# Patient Record
Sex: Female | Born: 1990 | Race: Black or African American | Hispanic: No | Marital: Single
Health system: Midwestern US, Community
[De-identification: ages and names within clinical notes are randomized; demographics above are authoritative.]

## PROBLEM LIST (undated history)

## (undated) DIAGNOSIS — F32A Depression, unspecified: Secondary | ICD-10-CM

## (undated) DIAGNOSIS — F419 Anxiety disorder, unspecified: Secondary | ICD-10-CM

## (undated) DIAGNOSIS — M797 Fibromyalgia: Secondary | ICD-10-CM

## (undated) DIAGNOSIS — F329 Major depressive disorder, single episode, unspecified: Secondary | ICD-10-CM

## (undated) HISTORY — PX: KNEE SURGERY: SHX244

---

## 2014-12-03 ENCOUNTER — Inpatient Hospital Stay
Admit: 2014-12-03 | Discharge: 2014-12-03 | Disposition: A | Discharge status: Home / Self Care | Payer: Self-pay | Attending: Emergency Medicine

## 2014-12-03 DIAGNOSIS — R51 Headache: Secondary | ICD-10-CM

## 2014-12-03 LAB — HCG URINE, QL. - POC: Pregnancy test,urine (POC): NEGATIVE

## 2014-12-03 LAB — URINALYSIS W/ REFLEX CULTURE
Bacteria: NEGATIVE /hpf
Bilirubin: NEGATIVE
Blood: NEGATIVE
Glucose: NEGATIVE mg/dL
Ketone: NEGATIVE mg/dL
Leukocyte Esterase: NEGATIVE
Nitrites: NEGATIVE
Protein: NEGATIVE mg/dL
Specific gravity: 1.015 (ref 1.003–1.030)
Urobilinogen: 0.2 EU/dL (ref 0.2–1.0)
pH (UA): 7 (ref 5.0–8.0)

## 2014-12-03 MED ORDER — BUTALBITAL-ACETAMINOPHEN-CAFFEINE 50 MG-325 MG-40 MG TAB
50-325-40 mg | ORAL_TABLET | ORAL | Status: DC | PRN
Start: 2014-12-03 — End: 2015-09-11

## 2014-12-03 MED ORDER — ACETAMINOPHEN 325 MG TABLET
325 mg | ORAL | Status: AC
Start: 2014-12-03 — End: 2014-12-03
  Administered 2014-12-03: 16:00:00 via ORAL

## 2014-12-03 MED FILL — MAPAP (ACETAMINOPHEN) 325 MG TABLET: 325 mg | ORAL | Qty: 2

## 2014-12-03 NOTE — ED Notes (Signed)
Patient alert and oriented and ambulatory out of ED.

## 2014-12-03 NOTE — ED Notes (Addendum)
Pt reports right sided headache x 2 weeks, recently got new glasses, also recently had an ear infection, pain occasionally on left, posterior side, denies n/v, denies shortness of breath or chest pain, reports occasional dizziness      Emergency Department Nursing Plan of Care       The Nursing Plan of Care is developed from the Nursing assessment and Emergency Department Attending provider initial evaluation.  The plan of care may be reviewed in the ???ED Provider note???.    The Plan of Care was developed with the following considerations:   Patient / Family readiness to learn indicated [id number] understanding  Persons(s) to be included in education: patient  Barriers to Learning/Limitations:No    Signed     REBECCA T Sharlot Gowda, RN    12/03/2014   11:15 AM

## 2014-12-03 NOTE — ED Notes (Signed)
Patient requesting something for pain. Provider notified and will place orders.

## 2014-12-03 NOTE — ED Notes (Addendum)
Discharge instructions and 1 prescriptions reviewed with patient. Patient verbalizes understanding and denies any questions.

## 2014-12-03 NOTE — ED Provider Notes (Addendum)
Patient is a 24 y.o. female presenting with headaches. The history is provided by the patient. No language interpreter was used.   Headache   This is a new problem. The current episode started more than 1 week ago (2 weeks). Episode frequency: daily. The problem has not changed since onset.The headache is aggravated by nothing. The pain is located in the frontal region. The quality of the pain is described as dull. The pain is moderate. Pertinent negatives include no fever, no near-syncope, no syncope, no weakness, no dizziness, no visual change, no nausea and no vomiting. She has tried NSAIDs for the symptoms.      Pt without migraine history presents with daily headaches for 2 weeks, coincident with losing her glasses and using a previous prescription. Has recently moved here from [place]. Family is all well. No other physical complaints, URI sx.     No past medical history on file.    No past surgical history on file.      No family history on file.    History     Social History   ??? Marital Status: N/A     Spouse Name: N/A   ??? Number of Children: N/A   ??? Years of Education: N/A     Occupational History   ??? Not on file.     Social History Main Topics   ??? Smoking status: Not on file   ??? Smokeless tobacco: Not on file   ??? Alcohol Use: Not on file   ??? Drug Use: Not on file   ??? Sexual Activity: Not on file     Other Topics Concern   ??? Not on file     Social History Narrative   ??? No narrative on file         ALLERGIES: Latex    Review of Systems   Constitutional: Negative for fever.   Cardiovascular: Negative for syncope and near-syncope.   Gastrointestinal: Negative for nausea and vomiting.   Neurological: Positive for headaches. Negative for dizziness and weakness.   All other systems reviewed and are negative.      Filed Vitals:    12/03/14 1050   BP: 119/96   Pulse: 76   Temp: 98.2 ??F (36.8 ??C)   Resp: 18   Height: 5\' 4"  (1.626 m)   Weight: 79.379 kg (175 lb)   SpO2: 98%            Physical Exam    Constitutional: She is oriented to person, place, and time. She appears well-developed and well-nourished. No distress.   Pleasant appropriate Black female here with young daughter   HENT:   Head: Normocephalic and atraumatic.   Nose: Nose normal.   Mouth/Throat: Oropharynx is clear and moist. No oropharyngeal exudate.   Macerated canals without edema or erythema, TMs clear   Eyes: Conjunctivae and EOM are normal. Pupils are equal, round, and reactive to light. Right eye exhibits no discharge. Left eye exhibits no discharge. No scleral icterus.   Neck: Normal range of motion. Neck supple. JVD present. No tracheal deviation present. No thyromegaly present.   Cardiovascular: Normal rate, regular rhythm and normal heart sounds.    No murmur heard.  Pulmonary/Chest: Effort normal and breath sounds normal. No stridor. No respiratory distress. She has no wheezes. She has no rales.   Abdominal: Soft. She exhibits no distension. There is no tenderness. There is no rebound and no guarding.   Musculoskeletal: Normal range of motion. She exhibits no edema  or tenderness.   Lymphadenopathy:     She has no cervical adenopathy.   Neurological: She is alert and oriented to person, place, and time. No cranial nerve deficit. Coordination normal.   Skin: Skin is warm and dry. No rash noted. She is not diaphoretic. No erythema. No pallor.   Psychiatric: She has a normal mood and affect. Her behavior is normal. Judgment and thought content normal.   Nursing note and vitals reviewed.       MDM    Procedures

## 2014-12-06 NOTE — Telephone Encounter (Signed)
Care manager called patient to follow up, patient is new to the area and needs primary care. Patient busy at time of call and states that she will call CM back later today.    Spero Geralds, MSW  [phone]

## 2014-12-09 NOTE — Telephone Encounter (Signed)
Care manager called patient to follow up. Identified as correct patient. Patient new to area, in need of primary care. Uninsured. CM [facility] as [facility]. Patient receptive to referral information and plans to follow up.    Spero GeraldsAlexandra Renwick Asman, MSW  [phone]

## 2015-01-11 DIAGNOSIS — J0100 Acute maxillary sinusitis, unspecified: Secondary | ICD-10-CM

## 2015-01-11 NOTE — ED Provider Notes (Signed)
HPI Comments: Tasha Jones is a 24 y.o. female who presents ambulatory to the ED c/o nasal congestion for 4 days.  She also c/o sore throat since yesterday and pain with swallowing since earlier today.  Pt endorses taking OTC sinus decongestant and Mucinex without any relief in symptoms.  She notes hx of strep throat in the past. Pt denies any recent illness or recent ABC.  She specifically denies any fevers, chills, nausea, vomiting, diarrhea, or abdominal pain.      PCP: None    There are no other complaints, changes or physical findings at this time.      The history is provided by the patient.        Past Medical History:   Diagnosis Date   ??? Asthma        Past Surgical History:   Procedure Laterality Date   ??? Hx orthopaedic       left knee   ??? Hx appendectomy     ??? Hx cesarean section     ??? Hx gyn       D and C   ??? Hx gyn       cyst removed from fallopian tube - laporoscopic   ??? Hx gyn       laporoscopic - to find IUD         History reviewed. No pertinent family history.    Social History     Social History   ??? Marital status: SINGLE     Spouse name: N/A   ??? Number of children: N/A   ??? Years of education: N/A     Occupational History   ??? Not on file.     Social History Main Topics   ??? Smoking status: Never Smoker   ??? Smokeless tobacco: Not on file   ??? Alcohol use Yes      Comment: occ   ??? Drug use: No   ??? Sexual activity: Not on file     Other Topics Concern   ??? Not on file     Social History Narrative         ALLERGIES: Latex    Review of Systems   Constitutional: Negative.  Negative for activity change, appetite change, chills, fatigue, fever and unexpected weight change.   HENT: Positive for congestion and sore throat. Negative for hearing loss, rhinorrhea, sneezing and voice change.    Eyes: Negative.  Negative for pain and visual disturbance.   Respiratory: Negative.  Negative for apnea, cough, choking, chest tightness and shortness of breath.     Cardiovascular: Negative.  Negative for chest pain and palpitations.   Gastrointestinal: Negative.  Negative for abdominal distention, abdominal pain, blood in stool, diarrhea, nausea and vomiting.   Genitourinary: Negative.  Negative for difficulty urinating, flank pain, frequency and urgency.        No discharge   Musculoskeletal: Negative.  Negative for arthralgias, back pain, myalgias and neck stiffness.   Skin: Negative.  Negative for color change and rash.   Neurological: Negative.  Negative for dizziness, seizures, syncope, speech difficulty, weakness, numbness and headaches.   Hematological: Negative for adenopathy.   Psychiatric/Behavioral: Negative.  Negative for agitation, behavioral problems, dysphoric mood and suicidal ideas. The patient is not nervous/anxious.        Vitals:    01/11/15 2012   BP: 143/83   Pulse: (!) 103   Resp: 20   Temp: 98.6 ??F (37 ??C)   SpO2: 98%   Weight: 83.9  kg (185 lb)   Height: 5\' 4"  (1.626 m)            Physical Exam   Nursing note and vitals reviewed.  Physical Examination: General appearance - WDWN, in no apparent distress  Head - NC/AT  Eyes - pupils equal, round  and reactive, extraocular eye movements intact, conj/sclera clear, anicteric  Mouth - mucous membranes moist, mildly swollen tonsils, left greater than right.  Erythema and exudate to BL tonsils.  Nose/Ears - nares clear, Tms & canals clear  Neck - supple, no significant adenopathy, trachea midline, no crepitus, c spine diffusely non-tender, no step offs  Chest - Normal respiratory effort, clear to auscultation bilaterally, no wheezes/rales/rhonchi  Heart - normal rate and regular rhythm, S1 and S2 normal, no murmurs, gallops, or rubs  Abdomen - soft, nontender, nondistended, nabs, no masses, guarding, rebound or rigidity  Neurological - alert, oriented, normal speech, cranial nerves intact, no focal motor findings, motor & sensory diffusely intact, normal gait   Extremities/MS - peripheral pulses normal, no pedal edema, all joints atraumatic, FROM, non-tender, no gross deformities, spine diffusely non-tender  Skin - normal coloration and turgor, no rashes, no lesions or lacerations        MDM  Number of Diagnoses or Management Options  Diagnosis management comments: DDx: strep throat, viral pharyngitis, URI       Amount and/or Complexity of Data Reviewed  Review and summarize past medical records: yes    Patient Progress  Patient progress: stable    ED Course       Procedures    MEDICATIONS GIVEN:  Medications   dexamethasone (DECADRON) injection 10 mg (10 mg Oral Given 01/11/15 2053)   cefTRIAXone (ROCEPHIN) 1 g in lidocaine (PF) (XYLOCAINE) 10 mg/mL (1 %) IM injection (1 g IntraMUSCular Given 01/11/15 2053)       IMPRESSION:  1. Acute tonsillitis, unspecified etiology    2. Acute maxillary sinusitis, recurrence not specified        PLAN:  1. Discharge home  Discharge Medication List as of 01/11/2015  8:45 PM      START taking these medications    Details   amoxicillin (AMOXIL) 875 mg tablet Take 1 Tab by mouth two (2) times a day for 10 days., Print, Disp-20 Tab, R-0      ibuprofen (MOTRIN) 600 mg tablet Take 1 Tab by mouth every six (6) hours as needed for Pain., Print, Disp-20 Tab, R-0      predniSONE (STERAPRED) 5 mg dose pack See administration instruction per 5mg  dose pack, Print, Disp-21 Tab, R-0         CONTINUE these medications which have NOT CHANGED    Details   butalbital-acetaminophen-caffeine (FIORICET) 50-325-40 mg per tablet Take 1 Tab by mouth every four (4) hours as needed for Headache. Max Daily Amount: 6 Tabs. Maximum dose 6 capsules daily, Print, Disp-15 Tab, R-0           2.   Follow-up Information     Follow up With Details Comments Contact Info    Trellis Moment, NP Schedule an appointment as soon as possible for a visit As needed [address]  [address]  [facility] [place] [address]  [phone]       [facility]   [address]  [place] [address]  [phone]      Return to ED if worse  DISCHARGE NOTE  8:55 PM  The patient has been re-evaluated and is ready for discharge. Reviewed available results with patient. Counseled pt on diagnosis and care plan. Pt has expressed understanding, and all questions have been answered. Pt agrees with plan and agrees to F/U as recommended, or return to the ED if their sxs worsen. Discharge instructions have been provided and explained to the pt, along with reasons to return to the ED.      This note is prepared by Norvel Richards, acting as Scribe for [employer]. Dorothey Baseman, MD.    Vergia Alcon S. Dorothey Baseman, MD: The scribe's documentation has been prepared under my direction and personally reviewed by me in its entirety. I confirm that the note above accurately reflects all work, treatment, procedures, and medical decision making performed by me.

## 2015-01-11 NOTE — ED Notes (Signed)
Discharge Instructions Reviewed with patient. Discharge instructions given to patient per this nurse. patient able to return verbalize discharge instructions. Paper copy of discharge instructions given. 3 RX given to patient per this nurse. Patient condition stable, Respiratory status WNL, Neurostatus intact. patient out of er, to home with self.

## 2015-01-11 NOTE — ED Notes (Signed)
Patient reports to ED c/o sore throat, nasal congestion, and HA since Saturday. Swelling and redness noted to throat. Patient in NAD.       Emergency Department Nursing Plan of Care       The Nursing Plan of Care is developed from the Nursing assessment and Emergency Department Attending provider initial evaluation.  The plan of care may be reviewed in the ???ED Provider note???.    The Plan of Care was developed with the following considerations:   Patient / Family readiness to learn indicated [id number] understanding  Persons(s) to be included in education: patient  Barriers to Learning/Limitations:No    Signed     Clarise Cruz, RN    01/11/2015   8:26 PM

## 2015-01-12 ENCOUNTER — Inpatient Hospital Stay
Admit: 2015-01-12 | Discharge: 2015-01-12 | Disposition: A | Discharge status: Home / Self Care | Payer: Self-pay | Attending: Emergency Medicine

## 2015-01-12 MED ORDER — IBUPROFEN 600 MG TAB
600 mg | ORAL_TABLET | Freq: Four times a day (QID) | ORAL | 0 refills | Status: DC | PRN
Start: 2015-01-12 — End: 2015-06-04

## 2015-01-12 MED ORDER — CEFTRIAXONE 1 GRAM SOLUTION FOR INJECTION
1 gram | Freq: Once | INTRAMUSCULAR | Status: AC
Start: 2015-01-12 — End: 2015-01-11
  Administered 2015-01-12: 01:00:00 via INTRAMUSCULAR

## 2015-01-12 MED ORDER — PREDNISONE 5 MG TABLETS IN A DOSE PACK
5 mg | ORAL_TABLET | ORAL | 0 refills | Status: DC
Start: 2015-01-12 — End: 2015-06-04

## 2015-01-12 MED ORDER — AMOXICILLIN 875 MG TAB
875 mg | ORAL_TABLET | Freq: Two times a day (BID) | ORAL | 0 refills | Status: AC
Start: 2015-01-12 — End: 2015-01-21

## 2015-01-12 MED ORDER — DEXAMETHASONE SODIUM PHOSPHATE 10 MG/ML IJ SOLN
10 mg/mL | INTRAMUSCULAR | Status: AC
Start: 2015-01-12 — End: 2015-01-11
  Administered 2015-01-12: 01:00:00 via ORAL

## 2015-01-12 MED FILL — CEFTRIAXONE 1 GRAM SOLUTION FOR INJECTION: 1 gram | INTRAMUSCULAR | Qty: 1

## 2015-01-12 MED FILL — DEXAMETHASONE SODIUM PHOSPHATE 10 MG/ML IJ SOLN: 10 mg/mL | INTRAMUSCULAR | Qty: 1

## 2015-06-04 ENCOUNTER — Emergency Department: Admit: 2015-06-04 | Payer: BLUE CROSS/BLUE SHIELD | Primary: Family Medicine

## 2015-06-04 ENCOUNTER — Inpatient Hospital Stay
Admit: 2015-06-04 | Discharge: 2015-06-04 | Disposition: A | Discharge status: Home / Self Care | Payer: BLUE CROSS/BLUE SHIELD | Attending: Emergency Medicine

## 2015-06-04 ENCOUNTER — Emergency Department: Payer: BLUE CROSS/BLUE SHIELD | Primary: Family Medicine

## 2015-06-04 DIAGNOSIS — M79652 Pain in left thigh: Secondary | ICD-10-CM

## 2015-06-04 MED ORDER — DIAZEPAM 5 MG TAB
5 mg | ORAL_TABLET | Freq: Two times a day (BID) | ORAL | 0 refills | Status: DC | PRN
Start: 2015-06-04 — End: 2015-08-03

## 2015-06-04 MED ORDER — IBUPROFEN 400 MG TAB
400 mg | ORAL | Status: AC
Start: 2015-06-04 — End: 2015-06-04
  Administered 2015-06-04: 17:00:00 via ORAL

## 2015-06-04 MED ORDER — HYDROCODONE-ACETAMINOPHEN 5 MG-325 MG TAB
5-325 mg | ORAL_TABLET | Freq: Four times a day (QID) | ORAL | 0 refills | Status: DC | PRN
Start: 2015-06-04 — End: 2015-08-03

## 2015-06-04 MED FILL — IBUPROFEN 400 MG TAB: 400 mg | ORAL | Qty: 2

## 2015-06-04 NOTE — ED Provider Notes (Signed)
HPI Comments: Tasha Jones is a 25 y.o. female who presents ambulatory to [facility] ED with cc of bilateral LE pain x 3 days. Pt states that she has also been experiencing bilateral lower back pain and lightheadedness. She notes that symptoms have been progressively worsening since onset with discomfort described as a sharp pain exacerbated by movement. Pt states that leg pain is primarily in bilateral medial aspect of the thighs with no relief from OTC medications. She denies any recent falls, injury, or trauma to the area. 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 Pt specifically denies any chest pain, hemoptysis, and SOB.     ZOX:WRUE    PMHx: asthma, s/p orthopedic surgery to left knee  Social Hx: - smoking; - EtOH; - illicit drug use    There are no other complains, changes, or physical findings at this time.    The history is provided by the patient. No language interpreter was used.        Past Medical History:   Diagnosis Date   ??? Asthma        Past Surgical History:   Procedure Laterality Date   ??? Hx orthopaedic       left knee   ??? Hx appendectomy     ??? Hx cesarean section     ??? Hx gyn       D and C   ??? Hx gyn       cyst removed from fallopian tube - laporoscopic   ??? Hx gyn       laporoscopic - to find IUD         No family history on file.    Social History     Social History   ??? Marital status: SINGLE     Spouse name: N/A   ??? Number of children: N/A   ??? Years of education: N/A     Occupational History   ??? Not on file.     Social History Main Topics   ??? Smoking status: Never Smoker   ??? Smokeless tobacco: Not on file   ??? Alcohol use Yes      Comment: occ   ??? Drug use: No   ??? Sexual activity: Not on file     Other Topics Concern   ??? Not on file     Social History Narrative         ALLERGIES: Latex    Review of Systems   Constitutional: Negative for chills and fever.   HENT: Negative for congestion.    Eyes: Negative for visual disturbance.    Respiratory: Negative for chest tightness.    Cardiovascular: Negative for chest pain and leg swelling.   Gastrointestinal: Negative for abdominal pain and vomiting.   Endocrine: Negative for polyuria.   Genitourinary: Negative for dysuria and frequency.   Musculoskeletal: Positive for back pain and myalgias.   Skin: Negative for color change.   Allergic/Immunologic: Negative for immunocompromised state.   Neurological: Positive for light-headedness. Negative for numbness.       Vitals:    06/04/15 1030 06/04/15 1031   BP: (!) 153/102 (!) 145/99   Pulse: 81    Resp: 16    SpO2: 99%    Weight: 79.4 kg (175 lb)    Height:  (1.626 m)             Physical Exam   Constitutional: She is oriented to person, place, and  time. She appears well-developed and well-nourished. No distress.   HENT:   Head: Normocephalic and atraumatic.   Right Ear: External ear normal.   Left Ear: External ear normal.   Nose: Nose normal.   Mouth/Throat: Oropharynx is clear and moist. No oropharyngeal exudate.   Eyes: Conjunctivae and EOM are normal. Pupils are equal, round, and reactive to light. Right eye exhibits no discharge. Left eye exhibits no discharge. No scleral icterus.   Neck: Normal range of motion. Neck supple. No JVD present. No tracheal deviation present.   Cardiovascular: Normal rate, regular rhythm, normal heart sounds and intact distal pulses.  Exam reveals no gallop and no friction rub.    No murmur heard.  Pulmonary/Chest: Effort normal and breath sounds normal. No respiratory distress. She has no wheezes. She has no rales. She exhibits no tenderness.   Abdominal: Soft. Bowel sounds are normal. She exhibits no distension and no mass. There is no tenderness. There is no rebound and no guarding.   Musculoskeletal: She exhibits tenderness. She exhibits no edema.   Decreased active and passive ROM secondary to pain.  Pain to bilateral abductors.  Negative SLR bilaterally.   Lymphadenopathy:      She has no cervical adenopathy.   Neurological: She is alert and oriented to person, place, and time. She has normal reflexes. No cranial nerve deficit. She exhibits normal muscle tone. Coordination normal.   Skin: Skin is warm and dry. She is not diaphoretic.   Psychiatric: She has a normal mood and affect. Her behavior is normal. Judgment and thought content normal.   Nursing note and vitals reviewed.       MDM  Number of Diagnoses or Management Options  Diagnosis management comments: DDx: DJD, strain, sprain, sciatica, DVT       Amount and/or Complexity of Data Reviewed  Tests in the radiology section of CPT??: reviewed and ordered  Review and summarize past medical records: yes    Patient Progress  Patient progress: stable    ED Course       Procedures    IMAGING RESULTS:   EXAM: DUPLEX LOWER EXT VENOUS BILAT  ??  INDICATION: Bilateral leg pain  ??  COMPARISON: None  ??  TECHNIQUE: Grayscale, Doppler color flow, and Doppler spectral analysis  sonographic imaging of the bilateral lower extremities  ??  FINDINGS: There is no evidence of filling defect within the lower extremity  veins, which demonstrate normal compressibility and flow.  ??  IMPRESSION  IMPRESSION:  ??  No deep venous thrombosis.  ??   ??      EXAM: XR SPINE LUMB 2 OR 3 V  ??  INDICATION: Bilateral leg pain and back pain.  ??  COMPARISON: None.  ??  TECHNIQUE: 3 views lumbar spine.  ??  FINDINGS: Vertebral body heights and intervertebral disc spaces are maintained.  There is no abnormality in alignment. The sacroiliac joints are unremarkable.  ??  IMPRESSION  IMPRESSION: No acute abnormality.   ??   ??       VITALS:  Patient Vitals for the past 12 hrs:   Pulse Resp BP SpO2   06/04/15 1031 - - (!) 145/99 -   06/04/15 1030 81 16 (!) 153/102 99 %       MEDICATIONS GIVEN:  Medications   ibuprofen (MOTRIN) tablet 800 mg (800 mg Oral Given 06/04/15 1224)       IMPRESSION:  1. Thigh pain, musculoskeletal, left    2. Thigh pain, musculoskeletal, right  PLAN:  1.    Current Discharge Medication List      START taking these medications    Details   HYDROcodone-acetaminophen (NORCO) 5-325 mg per tablet Take 1 Tab by mouth every six (6) hours as needed for Pain. Max Daily Amount: 4 Tabs.  Qty: 10 Tab, Refills: 0      diazePAM (VALIUM) 5 mg tablet Take 1 Tab by mouth every twelve (12) hours as needed (spasm). Max Daily Amount: 10 mg.  Qty: 10 Tab, Refills: 0         CONTINUE these medications which have NOT CHANGED    Details   butalbital-acetaminophen-caffeine (FIORICET) 50-325-40 mg per tablet Take 1 Tab by mouth every four (4) hours as needed for Headache. Max Daily Amount: 6 Tabs. Maximum dose 6 capsules daily  Qty: 15 Tab, Refills: 0           2.   Follow-up Information     Follow up With Details Comments Contact Info    Janece Canterbury, MD In 1 day As needed [address]  [address]  [place] [address]  [phone]          3. Return to ED if worse     Discharge Note:  1:15 PM  The patient has been re-evaluated and is ready for discharge. Reviewed available results with patient. Counseled patient on diagnosis and care plan. Patient has expressed understanding, and all questions have been answered. Patient agrees with plan and agrees to follow up as recommended, or to return to the ED if their symptoms worsen. Discharge instructions have been provided and explained to the patient, along with reasons to return to the ED.        This note is prepared by John Giovanni, acting as Scribe for [employer].    PA-C Greig Castilla [place]: The scribe's documentation has been prepared under my direction and personally reviewed by me in its entirety. I confirm that the note above accurately reflects all work, treatment, procedures, and medical decision making performed by me.

## 2015-06-04 NOTE — ED Notes (Signed)
Patient identified and read over and explained discharge instructions with time for questions by attending MD/PA.  Patient has verbalized understanding of discharge instructions.

## 2015-06-04 NOTE — ED Triage Notes (Addendum)
Triage Note: Pt c/o bilateral leg pain, worse to inner thighs since Friday morning. Has been laying in bed elevating them and applying ice. Has taken percocet, ativan, benadryl and ibuprofen with no relief. Has not taken anything this morning. Pt ambulated to triage without difficulty.

## 2015-08-03 ENCOUNTER — Inpatient Hospital Stay
Admit: 2015-08-03 | Discharge: 2015-08-03 | Disposition: A | Discharge status: Home / Self Care | Payer: Self-pay | Attending: Emergency Medicine

## 2015-08-03 DIAGNOSIS — G8921 Chronic pain due to trauma: Secondary | ICD-10-CM

## 2015-08-03 MED ORDER — TRAMADOL 50 MG TAB
50 mg | ORAL_TABLET | ORAL | 0 refills | Status: DC
Start: 2015-08-03 — End: 2015-08-03

## 2015-08-03 MED ORDER — MELOXICAM 15 MG TAB
15 mg | ORAL_TABLET | Freq: Every day | ORAL | 0 refills | Status: DC
Start: 2015-08-03 — End: 2015-09-17

## 2015-08-03 MED ORDER — HYDROCODONE-ACETAMINOPHEN 5 MG-325 MG TAB
5-325 mg | ORAL_TABLET | Freq: Four times a day (QID) | ORAL | 0 refills | Status: DC | PRN
Start: 2015-08-03 — End: 2015-09-11

## 2015-08-03 MED ORDER — TRAMADOL 50 MG TAB
50 mg | ORAL_TABLET | Freq: Four times a day (QID) | ORAL | 0 refills | Status: DC | PRN
Start: 2015-08-03 — End: 2015-08-03

## 2015-08-03 MED ORDER — TRAMADOL 50 MG TAB
50 mg | ORAL | Status: AC
Start: 2015-08-03 — End: 2015-08-03
  Administered 2015-08-03: 21:00:00 via ORAL

## 2015-08-03 MED FILL — TRAMADOL 50 MG TAB: 50 mg | ORAL | Qty: 1

## 2015-08-03 NOTE — ED Notes (Signed)
Pt waiting for narcotic prescription.

## 2015-08-03 NOTE — ED Provider Notes (Signed)
Patient is a 25 y.o. female presenting with leg pain.   Leg Pain    Pertinent negatives include no back pain and no neck pain.    No Trauma. Recurrent bilateral knee pain. Now out of hydrocodone.  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 [facility] and had MRIs of knees. PT was suggested, but was not able to follow up due to insurance issues.  To ED seeking pain relief.      Past Medical History:   Diagnosis Date   ??? Asthma        Past Surgical History:   Procedure Laterality Date   ??? HX APPENDECTOMY     ??? HX CESAREAN SECTION     ??? HX GYN      D and C   ??? HX GYN      cyst removed from fallopian tube - laporoscopic   ??? HX GYN      laporoscopic - to find IUD   ??? HX ORTHOPAEDIC      left knee         History reviewed. No pertinent family history.    Social History     Social History   ??? Marital status: SINGLE     Spouse name: N/A   ??? Number of children: N/A   ??? Years of education: N/A     Occupational History   ??? Not on file.     Social History Main Topics   ??? Smoking status: Never Smoker   ??? Smokeless tobacco: Not on file   ??? Alcohol use Yes      Comment: occ   ??? Drug use: No   ??? Sexual activity: Yes     Partners: Female     Other Topics Concern   ??? Not on file     Social History Narrative         ALLERGIES: Latex    Review of Systems   Constitutional: Negative for activity change, chills, diaphoresis and fatigue.   HENT: Negative for nosebleeds, trouble swallowing and voice change.    Eyes: Negative for discharge, itching and visual disturbance.   Respiratory: Negative for cough, choking, chest tightness, shortness of breath and wheezing.    Cardiovascular: Negative for chest pain, palpitations and leg swelling.   Gastrointestinal: Negative for abdominal pain, diarrhea, nausea and vomiting.   Genitourinary: Negative for dysuria, flank pain and urgency.   Musculoskeletal: Positive for arthralgias. Negative for back pain, neck pain and neck stiffness.    Skin: Negative for pallor and rash.   Neurological: Negative for seizures, syncope, weakness and headaches.   Psychiatric/Behavioral: Negative for agitation, confusion, self-injury, sleep disturbance and suicidal ideas.   All other systems reviewed and are negative.      Vitals:    08/03/15 1538   BP: (!) 149/96   Pulse: 76   Resp: 16   Temp: 98.8 ??F (37.1 ??C)   SpO2: 100%   Weight: 79.4 kg (175 lb)   Height: 5\' 4"  (1.626 m)            Physical Exam   Constitutional: She is oriented to person, place, and time. She appears well-developed and well-nourished.   HENT:   Head: Normocephalic and atraumatic.   Right Ear: External ear normal.   Left Ear: External ear normal.   Eyes: Pupils are equal, round, and reactive to light.   Neck: Normal range of motion. Neck supple.  Cardiovascular: Normal rate.    Pulmonary/Chest: Effort normal.   Abdominal: Soft. There is no tenderness.   Neurological: She is alert and oriented to person, place, and time.   Skin: Skin is warm and dry.   Psychiatric:   Tearful when discussing pain.     No lumbar ttp. Neg SLR   Mild diffuse tenderness to BLE B knees.   No effusion. Some clicking with knee ROM B.    No erythema. Distal NVI. Patellas tracking midline B.   Hips FROM. Ambulating without assistance.   Ankle DF/PF 5/5 B.    MDM  ED Course       Procedures         5:01 PM  At discharge [name] became tearful noting that she needs something for pain and she is worried that the ultram wont control her pain as she has taken it before. We discussed options. Discussed the ED role in management of chronic pain and our inability for reasons of safety, to prescribe large quantities of narcotis or to combine prescriptions.  She reluctantly seemed to understand.  Will change ultram prescription to hydrocodone (#8) and change nsaid from motrin to mobic    LABORATORY TESTS:  No results found for this or any previous visit (from the past 12 hour(s)).    IMAGING RESULTS:  No orders to display        MEDICATIONS GIVEN:  Medications   traMADol (ULTRAM) tablet 50 mg (50 mg Oral Given 08/03/15 1649)       IMPRESSION:  1. Bilateral chronic knee pain        PLAN:  1.   Current Discharge Medication List      START taking these medications    Details   meloxicam (MOBIC) 15 mg tablet Take 1 Tab by mouth daily.  Qty: 15 Tab, Refills: 0         CONTINUE these medications which have CHANGED    Details   HYDROcodone-acetaminophen (NORCO) 5-325 mg per tablet Take 1 Tab by mouth every six (6) hours as needed for Pain. Max Daily Amount: 4 Tabs.  Qty: 8 Tab, Refills: 0         CONTINUE these medications which have NOT CHANGED    Details   butalbital-acetaminophen-caffeine (FIORICET) 50-325-40 mg per tablet Take 1 Tab by mouth every four (4) hours as needed for Headache. Max Daily Amount: 6 Tabs. Maximum dose 6 capsules daily  Qty: 15 Tab, Refills: 0         STOP taking these medications       diazePAM (VALIUM) 5 mg tablet Comments:   Reason for Stopping:             2.   Follow-up Information     Follow up With Details Comments Contact Info    [facility]  [facility]   [address]  [address]  [place] [address]  [phone]        Return to ED if worse

## 2015-08-03 NOTE — ED Notes (Signed)
Patient (s)  given copy of dc instructions and 2 paper script(s) and 0 electronic scripts. Patient (s)  verbalized understanding of instructions and script (s). Patient given a current medication reconciliation form and verbalized understanding of their medications. Patient (s) verbalized understanding of the importance of discussing medications with his or her physician or clinic they will be following up with. Patient alert and oriented and in no acute distress. Patient offered wheelchair from treatment area to hospital entrance, patient denied wheelchair.

## 2015-08-03 NOTE — ED Notes (Signed)
Emergency Department Nursing Plan of Care       The Nursing Plan of Care is developed from the Nursing assessment and Emergency Department Attending provider initial evaluation.  The plan of care may be reviewed in the ???ED Provider note???.    The Plan of Care was developed with the following considerations:   Patient / Family readiness to learn indicated [id number]:verbalized understanding  Persons(s) to be included in education: patient  Barriers to Learning/Limitations:No    Signed     Stanford ScotlandMamta A Jailani Hogans, RN    08/03/2015   4:00 PM

## 2015-09-11 ENCOUNTER — Inpatient Hospital Stay
Admit: 2015-09-11 | Discharge: 2015-09-11 | Disposition: A | Discharge status: Home / Self Care | Payer: MEDICAID | Attending: Emergency Medicine

## 2015-09-11 DIAGNOSIS — M19072 Primary osteoarthritis, left ankle and foot: Secondary | ICD-10-CM

## 2015-09-11 LAB — CBC WITH AUTOMATED DIFF
ABS. BASOPHILS: 0.1 10*3/uL (ref 0.0–0.1)
ABS. EOSINOPHILS: 0.2 10*3/uL (ref 0.0–0.4)
ABS. LYMPHOCYTES: 2.7 10*3/uL (ref 0.8–3.5)
ABS. MONOCYTES: 0.6 10*3/uL (ref 0.0–1.0)
ABS. NEUTROPHILS: 8.7 10*3/uL — ABNORMAL HIGH (ref 1.8–8.0)
BASOPHILS: 1 % (ref 0–1)
EOSINOPHILS: 2 % (ref 0–7)
HCT: 34.4 % — ABNORMAL LOW (ref 35.0–47.0)
HGB: 12.1 g/dL (ref 11.5–16.0)
LYMPHOCYTES: 22 % (ref 12–49)
MCH: 28.2 PG (ref 26.0–34.0)
MCHC: 35.2 g/dL (ref 30.0–36.5)
MCV: 80.2 FL (ref 80.0–99.0)
MONOCYTES: 5 % (ref 5–13)
NEUTROPHILS: 70 % (ref 32–75)
PLATELET: 343 10*3/uL (ref 150–400)
RBC: 4.29 M/uL (ref 3.80–5.20)
RDW: 12.8 % (ref 11.5–14.5)
WBC: 12.3 10*3/uL — ABNORMAL HIGH (ref 3.6–11.0)

## 2015-09-11 LAB — RETICULOCYTE COUNT: Reticulocyte count: 0.9 % (ref 0.7–2.1)

## 2015-09-11 MED ORDER — HYDROCODONE-ACETAMINOPHEN 5 MG-325 MG TAB
5-325 mg | ORAL | Status: AC
Start: 2015-09-11 — End: 2015-09-11
  Administered 2015-09-11: 21:00:00 via ORAL

## 2015-09-11 MED ORDER — HYDROCODONE-ACETAMINOPHEN 5 MG-325 MG TAB
5-325 mg | ORAL_TABLET | ORAL | 0 refills | Status: DC | PRN
Start: 2015-09-11 — End: 2015-09-17

## 2015-09-11 MED ORDER — PREDNISONE 20 MG TAB
20 mg | ORAL | Status: AC
Start: 2015-09-11 — End: 2015-09-11
  Administered 2015-09-11: 21:00:00 via ORAL

## 2015-09-11 MED ORDER — PREDNISONE 20 MG TAB
20 mg | ORAL_TABLET | Freq: Every day | ORAL | 0 refills | Status: AC
Start: 2015-09-11 — End: 2015-09-16

## 2015-09-11 MED FILL — PREDNISONE 20 MG TAB: 20 mg | ORAL | Qty: 3

## 2015-09-11 MED FILL — HYDROCODONE-ACETAMINOPHEN 5 MG-325 MG TAB: 5-325 mg | ORAL | Qty: 1

## 2015-09-11 NOTE — ED Notes (Signed)
Patient  given copy of dc instructions and 1 paper script(s) and 1 electronic scripts.  Patient  verbalized understanding of instructions and script (s).  Patient given a current medication reconciliation form and verbalized understanding of their medications.   Patient verbalized understanding of the importance of discussing medications with  his or her physician or clinic they will be following up with.  Patient alert and oriented and in no acute distress.  Patient offered wheelchair from treatment area to hospital entrance, patient declines wheelchair.

## 2015-09-11 NOTE — ED Notes (Signed)
Patient here for bilateral leg/knee pain. Reports history of left knee surgery. Reports being in physical therapy for her knees.  Hx arthritis.  States "I just got sent to this pain management place where they do this cream and this gel injection."  Reports heat and ice applied at home and topical cream at home, Mobic and Tramadol for pain, reports no relief.  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    Provider at bedside.  Reports last at ortho practice 2 weeks ago, and reports first time at pain management clinic last week.          Emergency Department Nursing Plan of Care       The Nursing Plan of Care is developed from the Nursing assessment and Emergency Department Attending provider initial evaluation.  The plan of care may be reviewed in the ???ED Provider note???.    The Plan of Care was developed with the following considerations:   Patient / Family readiness to learn indicated ZO:[id number]:verbalized understanding  Persons(s) to be included in education: patient  Barriers to Learning/Limitations:No    Signed     Darnelle Catalanerial L White, RN    09/11/2015   4:44 PM

## 2015-09-11 NOTE — ED Provider Notes (Signed)
HPI Comments: Tasha Jones is a 25 y.o. female with pertinent PMHx of orthopaedic left knee surgery, arthritis and sickle cell presenting ambulatory to the ED c/o acute on chronic 9/10 bilateral aching knee pain x "a while". Pt states that she has followed up with orthopaedics (last visit two weeks ago) and pain management (last visit ~1 week ago). Pt notes that she has attempted knee braces, physical therapy, topical cream, ice/heat therapy, Mobic and Tramadol with no relief. Pt states that her orthopaedist has recently advised trying "jell injections" and weight loss, but does not advise knee replacements due to her age. Pt states that her sickle cell is not currently being managed. Pt specifically denies any leg swelling or fever/chills.    PCP: [facility]  Orthopaedics: Thorp J. Earlene Plater, MD  Social Hx: - tobacco use, + occasional alcohol use, - illicit drug use    There are no other complaints, changes, or physical findings at this time.    The history is provided by the patient. No language interpreter was used.        Past Medical History:   Diagnosis Date   ??? Arthritis    ??? Asthma    ??? Sickle cell crisis [facility])        Past Surgical History:   Procedure Laterality Date   ??? HX APPENDECTOMY     ??? HX CESAREAN SECTION     ??? HX GYN      D and C   ??? HX GYN      cyst removed from fallopian tube - laporoscopic   ??? HX GYN      laporoscopic - to find IUD   ??? HX ORTHOPAEDIC      left knee         History reviewed. No pertinent family history.    Social History     Social History   ??? Marital status: SINGLE     Spouse name: N/A   ??? Number of children: N/A   ??? Years of education: N/A     Occupational History   ??? Not on file.     Social History Main Topics   ??? Smoking status: Never Smoker   ??? Smokeless tobacco: Not on file   ??? Alcohol use Yes      Comment: occ   ??? Drug use: No   ??? Sexual activity: Yes     Partners: Female     Other Topics Concern   ??? Not on file     Social History Narrative         ALLERGIES: Latex     Review of Systems   Constitutional: Negative for chills and fever.   HENT: Negative for congestion, rhinorrhea, sneezing and sore throat.    Eyes: Negative for redness and visual disturbance.   Respiratory: Negative for shortness of breath.    Cardiovascular: Negative for leg swelling.   Gastrointestinal: Negative for abdominal pain, nausea and vomiting.   Genitourinary: Negative for difficulty urinating and frequency.   Musculoskeletal: Positive for arthralgias (bilateral knees). Negative for back pain and neck stiffness.   Skin: Negative for rash.   Neurological: Negative for dizziness, syncope, weakness and headaches.   Hematological: Negative for adenopathy.       Vitals:    09/11/15 1637   BP: 135/88   Pulse: 76   Resp: 16   Temp: 98.5 ??F (36.9 ??C)   SpO2: 96%   Weight: 81.6 kg (180 lb)   Height: 5\' 3"  (  1.6 m)            Physical Exam   Constitutional: She is oriented to person, place, and time. She appears well-developed and well-nourished.   Appears uncomfortable  Tearful   HENT:   Head: Normocephalic and atraumatic.   Mouth/Throat: Oropharynx is clear and moist.   Eyes: Conjunctivae and EOM are normal.   Neck: Normal range of motion and full passive range of motion without pain. Neck supple.   Cardiovascular: Normal rate, regular rhythm, S1 normal, S2 normal, normal heart sounds, intact distal pulses and normal pulses.    No murmur heard.  Pulmonary/Chest: Effort normal and breath sounds normal. No respiratory distress. She has no wheezes.   Abdominal: Soft. Normal appearance and bowel sounds are normal. She exhibits no distension. There is no tenderness. There is no rebound.   Musculoskeletal: Normal range of motion.   Neurological: She is alert and oriented to person, place, and time. She has normal strength.   Skin: Skin is warm, dry and intact. No rash noted.   Psychiatric: She has a normal mood and affect. Her speech is normal and behavior is normal. Judgment and thought content normal.    Nursing note and vitals reviewed.       MDM  Number of Diagnoses or Management Options  Diagnosis management comments: DDx: arthritis, sickle cell crisis, chronic pain       Amount and/or Complexity of Data Reviewed  Clinical lab tests: reviewed and ordered  Review and summarize past medical records: yes    Patient Progress  Patient progress: stable    ED Course       Procedures    Progress Note:  5:34 PM  Pt and/or family have been updated on their results. Pt will be sent home with orthopaedic follow up. Pt and/or pt's family are aware of the plan of care and are in agreement.   Written by Lina Sar Ford, ED Scribe, as dictated by Costella Hatcher, MD.     LABORATORY TESTS:  Recent Results (from the past 12 hour(s))   CBC WITH AUTOMATED DIFF    Collection Time: 09/11/15  5:07 PM   Result Value Ref Range    WBC 12.3 (H) 3.6 - 11.0 K/uL    RBC 4.29 3.80 - 5.20 M/uL    HGB 12.1 11.5 - 16.0 g/dL    HCT 98.1 (L) 19.1 - 47.0 %    MCV 80.2 80.0 - 99.0 FL    MCH 28.2 26.0 - 34.0 PG    MCHC 35.2 30.0 - 36.5 g/dL    RDW 47.8 29.5 - 62.1 %    PLATELET 343 150 - 400 K/uL    NEUTROPHILS 70 32 - 75 %    LYMPHOCYTES 22 12 - 49 %    MONOCYTES 5 5 - 13 %    EOSINOPHILS 2 0 - 7 %    BASOPHILS 1 0 - 1 %    ABS. NEUTROPHILS 8.7 (H) 1.8 - 8.0 K/UL    ABS. LYMPHOCYTES 2.7 0.8 - 3.5 K/UL    ABS. MONOCYTES 0.6 0.0 - 1.0 K/UL    ABS. EOSINOPHILS 0.2 0.0 - 0.4 K/UL    ABS. BASOPHILS 0.1 0.0 - 0.1 K/UL   RETICULOCYTE COUNT    Collection Time: 09/11/15  5:07 PM   Result Value Ref Range    Reticulocyte count 0.9 0.7 - 2.1 %       MEDICATIONS GIVEN:  Medications   HYDROcodone-acetaminophen (NORCO) 5-325 mg  per tablet 1 Tab (1 Tab Oral Given 09/11/15 1705)   predniSONE (DELTASONE) tablet 60 mg (60 mg Oral Given 09/11/15 1705)       IMPRESSION:  1. Osteoarthritis of both knees, unspecified osteoarthritis type    2. Chronic pain of both knees        PLAN:  1.   Current Discharge Medication List      START taking these medications    Details    predniSONE (DELTASONE) 20 mg tablet Take 3 Tabs by mouth daily for 5 days.  Qty: 15 Tab, Refills: 0      HYDROcodone-acetaminophen (NORCO) 5-325 mg per tablet Take 1 Tab by mouth every four (4) hours as needed for Pain. Max Daily Amount: 6 Tabs.  Qty: 8 Tab, Refills: 0           2.   Follow-up Information     Follow up With Details Comments Contact Info    Jeni Sallesustin C Dyer, DO In 2 days for orthopaedic follow up [address]  [address]  [place] [id number]  [phone]      Jeni Sallesustin C Dyer, DO In 2 days for orthopaedic follow up [address]  [address]  [place] [id number]  [phone]      [facility] EMERGENCY DEPT  As needed, If symptoms worsen [address]  [place] [id number]  [phone]        Return to ED if worse     DISCHARGE NOTE:  5:35 PM  The patient is ready for discharge. The patient's signs, symptoms, diagnosis, and discharge instructions have been discussed and the patient and/or family has conveyed their understanding. The patient and/or family is to follow up as recommended or return to the ER should their symptoms worsen. Plan has been discussed and the patient and/or family is in agreement.   Written by Lina SarBrooke A. Ala DachFord, ED Scribe, as dictated by Costella HatcherMichael W. Kerensa Nicklas, MD.     Attestation:  This note is prepared by Nehemiah SettleBrooke A. Ford, acting as Neurosurgeoncribe for Costella HatcherMichael W. Neiko Trivedi, MD.    Costella HatcherMichael W. Burdett Pinzon, MD: The scribe's documentation has been prepared under my direction and personally reviewed by me in its entirety. I confirm that the note above accurately reflects all work, treatment, procedures, and medical decision making performed by me.

## 2015-09-17 ENCOUNTER — Inpatient Hospital Stay
Admit: 2015-09-17 | Discharge: 2015-09-18 | Disposition: A | Discharge status: Home / Self Care | Payer: MEDICAID | Attending: Emergency Medicine

## 2015-09-17 ENCOUNTER — Emergency Department: Admit: 2015-09-18 | Payer: MEDICAID | Primary: Family Medicine

## 2015-09-17 DIAGNOSIS — N83202 Unspecified ovarian cyst, left side: Secondary | ICD-10-CM

## 2015-09-17 MED ORDER — OXYCODONE-ACETAMINOPHEN 5 MG-325 MG TAB
5-325 mg | ORAL | Status: AC
Start: 2015-09-17 — End: 2015-09-17
  Administered 2015-09-18: 01:00:00 via ORAL

## 2015-09-17 NOTE — ED Provider Notes (Signed)
HPI Comments: Tasha Jones is a 25 y.o. female with PMHx significant for ovarian cysts, asthma, who presents ambulatory to [facility] ED with cc of acute onset, progressively worsening cramping/dull LLQ abdominal pain on 09/15/2015. Patient reports associated nausea. She endorses taking Norco earlier this morning with no relief. Patient states she went to [facility] today and was told to be evaluated by the ED to have an [place] for a possible ovarian cyst. She notes her last BM was this morning and states it was normal. Patient states her LMP was on 09/02/2015. She specifically denies any hematochezia, fever, or dysuria.       Social History: (-) Tobacco, (+) EtOH, (-) Illicit Drugs   Surgical History: laparoscopic cyst removal, appendectomy, c-section    There are no other complaints, changes, or physical findings at this time.  Written by Earnest Rosier, ED Scribe, as dictated by Su Grand, DO.       The history is provided by the patient.        Past Medical History:   Diagnosis Date   ??? Arthritis    ??? Asthma    ??? Sickle cell crisis [facility])        Past Surgical History:   Procedure Laterality Date   ??? HX APPENDECTOMY     ??? HX CESAREAN SECTION     ??? HX GYN      D and C   ??? HX GYN      cyst removed from fallopian tube - laporoscopic   ??? HX GYN      laporoscopic - to find IUD   ??? HX ORTHOPAEDIC      left knee         No family history on file.    Social History     Social History   ??? Marital status: SINGLE     Spouse name: N/A   ??? Number of children: N/A   ??? Years of education: N/A     Occupational History   ??? Not on file.     Social History Main Topics   ??? Smoking status: Never Smoker   ??? Smokeless tobacco: Not on file   ??? Alcohol use Yes      Comment: occ   ??? Drug use: No   ??? Sexual activity: Yes     Partners: Female     Other Topics Concern   ??? Not on file     Social History Narrative         ALLERGIES: Latex    Review of Systems   Constitutional: Negative for fatigue and fever.   HENT: Negative.     Eyes: Negative.    Respiratory: Negative for shortness of breath and wheezing.    Cardiovascular: Negative for chest pain and leg swelling.   Gastrointestinal: Positive for abdominal pain and nausea. Negative for blood in stool, constipation, diarrhea and vomiting.   Endocrine: Negative.    Genitourinary: Negative for difficulty urinating and dysuria.   Musculoskeletal: Negative.    Skin: Negative for rash.   Allergic/Immunologic: Negative.    Neurological: Negative for weakness and numbness.   Hematological: Negative.    Psychiatric/Behavioral: Negative.        Vitals:    09/17/15 1930   BP: (!) 139/92   Pulse: 73   Resp: 18   Temp: 97.8 ??F (36.6 ??C)   SpO2: 100%   Weight: 81.1 kg (178 lb 12.7 oz)   Height:  (1.626 m)  Physical Exam   Constitutional: She is oriented to person, place, and time. She appears well-developed and well-nourished. No distress.   HENT:   Head: Normocephalic and atraumatic.   Mouth/Throat: Oropharynx is clear and moist.   Eyes: Conjunctivae and EOM are normal.   Neck: Neck supple. No JVD present. No tracheal deviation present.   Cardiovascular: Normal rate, regular rhythm and intact distal pulses.  Exam reveals no gallop and no friction rub.    No murmur heard.  Pulmonary/Chest: Effort normal and breath sounds normal. No stridor. No respiratory distress. She has no wheezes.   Abdominal: Soft. Bowel sounds are normal. She exhibits no distension and no mass. There is tenderness in the left lower quadrant. There is no guarding.   Musculoskeletal: Normal range of motion. She exhibits no edema or tenderness.   No deformity   Neurological: She is alert and oriented to person, place, and time. She has normal strength.   No focal deficits   Skin: Skin is warm, dry and intact. No rash noted.   Psychiatric: She has a normal mood and affect. Her behavior is normal. Judgment and thought content normal.   Nursing note and vitals reviewed.       MDM   Number of Diagnoses or Management Options  Abdominal pain, LLQ (left lower quadrant):   Left ovarian cyst:   Diagnosis management comments: DDx: ovarian cyst, uti, ovarian torsion, constipation. [facility], already had an unremarkable CBC, BMP, UA and negative upt. Will get transvaginal US, treat symptomatically with analgesia.        Amount and/or Complexity of Data Reviewed  Tests in the radiology section of CPT??: ordered and reviewed  Review and summarize past medical records: yes    Patient Progress  Patient progress: stable    ED Course       Procedures    PROGRESS NOTE  7:58 PM   They did CBC, BMP, and pelvic exam. She had a WBC of 11.7. BMP was unremarkable. Her urine showed no infection. No clue cells, trichomonas, or yeast. Negative pregnancy test.    Written by Earnest RosierAlya Aboulhosn, ED Scribe, as dictated by Su Grandonstance S. Wynn Alldredge, DO.    IMAGING RESULTS:  US TRANSVAGINAL   Final Result   HISTORY: Left lower quadrant pain  ??  Technique: Routine transvaginal ultrasound images were obtained.  ??  FINDINGS:   Transvaginal images demonstrate a normal endometrial stripe measuring 5 mm in  thickness. The uterus measures 6.5 x 5.1 x 5.3 cm. No myometrial mass is seen.   The cervix has a normal appearance. The right ovary measures 3.2 cm. The left  ovary measures 4.7 cm. There is intact vascularity to both ovaries. There is a  left ovarian cyst measuring 3.9 x 2.6 x 3 cm. The cervix contains calcifications  and nabothian cysts. There is no pelvic free fluid.   ??  IMPRESSION  IMPRESSION:  Left ovarian cyst measuring 3.9 cm.       MEDICATIONS GIVEN:  Medications   oxyCODONE-acetaminophen (PERCOCET) 5-325 mg per tablet 2 Tab (2 Tabs Oral Given 09/17/15 2100)   ibuprofen (MOTRIN) tablet 600 mg (600 mg Oral Given 09/17/15 2100)       IMPRESSION:  1. Left ovarian cyst    2. Abdominal pain, LLQ (left lower quadrant)        PLAN:  1.   Discharge Medication List as of 09/17/2015 10:04 PM       START taking these medications  Details   oxyCODONE-acetaminophen (PERCOCET) 5-325 mg per tablet Take 1 Tab by mouth every four (4) hours as needed for Pain. Max Daily Amount: 6 Tabs., Print, Disp-20 Tab, R-0         CONTINUE these medications which have NOT CHANGED    Details   glucosamine (GLUCOSAMINE RELIEF) 1,000 mg tab Take  by mouth., Historical Med      ergocalciferol (VITAMIN D2) 50,000 unit capsule Take 50,000 Units by mouth., Historical Med         STOP taking these medications       predniSONE (DELTASONE) 20 mg tablet Comments:   Reason for Stopping:             2.   Follow-up Information     Follow up With Details Comments Contact Info    Your OBGYN Schedule an appointment as soon as possible for a visit in 1 day            Return to ED if worse     Discharge Note:  10:04 PM  The patient has been re-evaluated and is ready for discharge. Reviewed available results with patient. Counseled patient on diagnosis and care plan. Patient has expressed understanding, and all questions have been answered. Patient agrees with plan and agrees to follow up as recommended, or to return to the ED if their symptoms worsen. Discharge instructions have been provided and explained to the patient, along with reasons to return to the ED.  Written by Earnest Rosier, ED Scribe, as dictated by Su Grand, DO.    This note is prepared by Earnest Rosier, acting as Neurosurgeon for Su Grand, DO.    Su Grand, DO: The scribe's documentation has been prepared under my direction and personally reviewed by me in its entirety. I confirm that the note above accurately reflects all work, treatment, procedures, and medical decision making performed by me.

## 2015-09-17 NOTE — ED Notes (Signed)
The physician has reviewed discharge instructions with the patient.  The patient verbalized understanding. Patient ambulated out of the Emergency Department

## 2015-09-18 MED ORDER — OXYCODONE-ACETAMINOPHEN 5 MG-325 MG TAB
5-325 mg | ORAL_TABLET | ORAL | 0 refills | Status: DC | PRN
Start: 2015-09-18 — End: 2016-02-13

## 2015-09-18 MED ORDER — DIPHENHYDRAMINE 25 MG CAP
25 mg | ORAL_CAPSULE | Freq: Four times a day (QID) | ORAL | 0 refills | Status: AC | PRN
Start: 2015-09-18 — End: 2015-09-27

## 2015-09-18 MED ORDER — IBUPROFEN 600 MG TAB
600 mg | ORAL | Status: AC
Start: 2015-09-18 — End: 2015-09-17
  Administered 2015-09-18: 01:00:00 via ORAL

## 2015-09-18 MED FILL — OXYCODONE-ACETAMINOPHEN 5 MG-325 MG TAB: 5-325 mg | ORAL | Qty: 2

## 2015-09-18 MED FILL — IBUPROFEN 600 MG TAB: 600 mg | ORAL | Qty: 1

## 2015-11-05 ENCOUNTER — Inpatient Hospital Stay
Admit: 2015-11-05 | Discharge: 2015-11-05 | Disposition: A | Discharge status: Home / Self Care | Payer: MEDICAID | Attending: Emergency Medicine

## 2015-11-05 ENCOUNTER — Emergency Department: Admit: 2015-11-05 | Payer: MEDICAID | Primary: Family Medicine

## 2015-11-05 DIAGNOSIS — G8929 Other chronic pain: Secondary | ICD-10-CM

## 2015-11-05 LAB — CBC WITH AUTOMATED DIFF
ABS. BASOPHILS: 0.1 10*3/uL (ref 0.0–0.1)
ABS. EOSINOPHILS: 0.3 10*3/uL (ref 0.0–0.4)
ABS. LYMPHOCYTES: 2.6 10*3/uL (ref 0.8–3.5)
ABS. MONOCYTES: 0.6 10*3/uL (ref 0.0–1.0)
ABS. NEUTROPHILS: 6.3 10*3/uL (ref 1.8–8.0)
BASOPHILS: 1 % (ref 0–1)
EOSINOPHILS: 3 % (ref 0–7)
HCT: 33.3 % — ABNORMAL LOW (ref 35.0–47.0)
HGB: 11.8 g/dL (ref 11.5–16.0)
LYMPHOCYTES: 27 % (ref 12–49)
MCH: 28.4 PG (ref 26.0–34.0)
MCHC: 35.4 g/dL (ref 30.0–36.5)
MCV: 80 FL (ref 80.0–99.0)
MONOCYTES: 6 % (ref 5–13)
NEUTROPHILS: 63 % (ref 32–75)
PLATELET: 382 10*3/uL (ref 150–400)
RBC: 4.16 M/uL (ref 3.80–5.20)
RDW: 12.5 % (ref 11.5–14.5)
WBC: 9.8 10*3/uL (ref 3.6–11.0)

## 2015-11-05 LAB — METABOLIC PANEL, COMPREHENSIVE
A-G Ratio: 0.9 — ABNORMAL LOW (ref 1.1–2.2)
ALT (SGPT): 19 U/L (ref 12–78)
AST (SGOT): 29 U/L (ref 15–37)
Albumin: 3.5 g/dL (ref 3.5–5.0)
Alk. phosphatase: 48 U/L (ref 45–117)
Anion gap: 7 mmol/L (ref 5–15)
BUN/Creatinine ratio: 12 (ref 12–20)
BUN: 9 MG/DL (ref 6–20)
Bilirubin, total: 0.3 MG/DL (ref 0.2–1.0)
CO2: 28 mmol/L (ref 21–32)
Calcium: 8.6 MG/DL (ref 8.5–10.1)
Chloride: 104 mmol/L (ref 97–108)
Creatinine: 0.75 MG/DL (ref 0.55–1.02)
GFR est AA: >60 mL/min/{1.73_m2} (ref >60)
GFR est non-AA: >60 mL/min/{1.73_m2} (ref >60)
Globulin: 3.9 g/dL (ref 2.0–4.0)
Glucose: 91 mg/dL (ref 65–100)
Potassium: 4.4 mmol/L (ref 3.5–5.1)
Protein, total: 7.4 g/dL (ref 6.4–8.2)
Sodium: 139 mmol/L (ref 136–145)

## 2015-11-05 LAB — URINALYSIS W/ REFLEX CULTURE
Bacteria: NEGATIVE /hpf
Bilirubin: NEGATIVE
Glucose: NEGATIVE mg/dL
Ketone: NEGATIVE mg/dL
Leukocyte Esterase: NEGATIVE
Nitrites: NEGATIVE
Protein: NEGATIVE mg/dL
Specific gravity: 1.02 (ref 1.003–1.030)
Urobilinogen: 0.2 EU/dL (ref 0.2–1.0)
pH (UA): 5.5 (ref 5.0–8.0)

## 2015-11-05 LAB — HCG URINE, QL. - POC: Pregnancy test,urine (POC): NEGATIVE

## 2015-11-05 MED ORDER — ONDANSETRON (PF) 4 MG/2 ML INJECTION
4 mg/2 mL | INTRAMUSCULAR | Status: AC
Start: 2015-11-05 — End: 2015-11-05
  Administered 2015-11-05: 19:00:00 via INTRAVENOUS

## 2015-11-05 MED ORDER — KETOROLAC TROMETHAMINE 30 MG/ML INJECTION
30 mg/mL (1 mL) | INTRAMUSCULAR | Status: AC
Start: 2015-11-05 — End: 2015-11-05
  Administered 2015-11-05: 19:00:00 via INTRAVENOUS

## 2015-11-05 MED ORDER — ONDANSETRON 4 MG TAB, RAPID DISSOLVE
4 mg | ORAL_TABLET | Freq: Three times a day (TID) | ORAL | 0 refills | Status: DC | PRN
Start: 2015-11-05 — End: 2016-02-13

## 2015-11-05 MED FILL — KETOROLAC TROMETHAMINE 30 MG/ML INJECTION: 30 mg/mL (1 mL) | INTRAMUSCULAR | Qty: 1

## 2015-11-05 MED FILL — ONDANSETRON (PF) 4 MG/2 ML INJECTION: 4 mg/2 mL | INTRAMUSCULAR | Qty: 2

## 2015-11-05 NOTE — ED Notes (Signed)
Pt presents to ED c/o generalized body aches and knee pain starting this AM upon waking. Pt states she took prescribed Mobic, Tylenol, and Norco w/o relief. Norco last taken at 1130. Pt placed in gown and in no acute distress. Pt denies any recent injury.      Emergency Department Nursing Plan of Care       The Nursing Plan of Care is developed from the Nursing assessment and Emergency Department Attending provider initial evaluation.  The plan of care may be reviewed in the ???ED Provider note???.    The Plan of Care was developed with the following considerations:   Patient / Family readiness to learn indicated DG:[id number]:verbalized understanding  Persons(s) to be included in education: patient  Barriers to Learning/Limitations:No    Signed     Nolon StallsHannah M Maiyah Goyne, RN    11/05/2015   2:09 PM

## 2015-11-05 NOTE — ED Notes (Signed)
Discharge instructions were given to the patient by Jionni Helming, RN.     Patient was given 1 prescriptions and was encouraged to call or return to the ED for worsening issues or problems and was encouraged to schedule a follow up appointment for continuing care.     Patient given a current medication reconciliation form and verbalized understanding of their medications and importance of discussing medications at follow-up.The patient verbalized understanding of discharge instructions and prescriptions, all questions were answered. The patient has no further concerns at this time. Patient stable at time of discharge.The patient left the Emergency Department ambulatory, with self, and in no acute distress.    Patient declined wheelchair transport out of Emergency Department.

## 2015-11-05 NOTE — ED Provider Notes (Signed)
Patient is a 25 y.o. female presenting with knee pain and general illness. The history is provided by the patient.   Knee Pain    This is a chronic (pt reports hx of chronic knee pain, "no cartilage in knee" and previous surgery) problem. The problem occurs constantly. The problem has not changed since onset.The pain is present in the left knee. The quality of the pain is described as constant. The pain is at a severity of 8/10. The pain is moderate. The symptoms are aggravated by palpation and movement. Treatments tried: Mobic and Norco-PTA. The treatment provided no relief. There has been no history of extremity trauma.   Generalized Body Aches   This is a new problem. The current episode started 6 to 12 hours ago. The problem occurs constantly. The problem has not changed since onset.Associated symptoms include headaches. Pertinent negatives include no chest pain, no abdominal pain and no shortness of breath. Nothing aggravates the symptoms. Nothing relieves the symptoms. She has tried acetaminophen for the symptoms. The treatment provided no relief.        Past Medical History:   Diagnosis Date   ??? Arthritis    ??? Asthma    ??? Sickle cell crisis [facility](HCC)        Past Surgical History:   Procedure Laterality Date   ??? HX APPENDECTOMY     ??? HX CESAREAN SECTION     ??? HX GYN      D and C   ??? HX GYN      cyst removed from fallopian tube - laporoscopic   ??? HX GYN      laporoscopic - to find IUD   ??? HX ORTHOPAEDIC      left knee         History reviewed. No pertinent family history.    Social History     Social History   ??? Marital status: SINGLE     Spouse name: N/A   ??? Number of children: N/A   ??? Years of education: N/A     Occupational History   ??? Not on file.     Social History Main Topics   ??? Smoking status: Never Smoker   ??? Smokeless tobacco: Never Used   ??? Alcohol use Yes      Comment: occ   ??? Drug use: No   ??? Sexual activity: Yes     Partners: Female     Other Topics Concern   ??? Not on file      Social History Narrative         ALLERGIES: Latex    Review of Systems   Constitutional: Negative for fever.   Respiratory: Negative for shortness of breath.    Cardiovascular: Negative for chest pain.   Gastrointestinal: Positive for nausea. Negative for abdominal pain and vomiting.   Musculoskeletal: Positive for arthralgias, gait problem and joint swelling.   Skin: Negative.    Neurological: Positive for headaches. Negative for speech difficulty.   All other systems reviewed and are negative.      Vitals:    11/05/15 1401 11/05/15 1403   BP:  (!) 175/108   Pulse: 86    Resp: 18    Temp: 98.6 ??F (37 ??C)    SpO2: 97%    Weight: 83.9 kg (185 lb)    Height: 5\' 3"  (1.6 m)             Physical Exam   Constitutional: She is oriented to person,  place, and time. She appears well-developed and well-nourished. No distress.   HENT:   Head: Normocephalic and atraumatic.   Eyes: Conjunctivae are normal.   Cardiovascular: Normal rate, regular rhythm and normal heart sounds.    Pulmonary/Chest: Effort normal and breath sounds normal. No respiratory distress. She has no wheezes. She has no rales.   Musculoskeletal:        Left knee: She exhibits decreased range of motion and swelling. She exhibits no effusion, no ecchymosis, no deformity, no laceration, no erythema and normal alignment. Tenderness found. Medial joint line and lateral joint line tenderness noted.   Neurological: She is alert and oriented to person, place, and time.   Skin: Skin is warm and dry.   Psychiatric: She has a normal mood and affect. Her behavior is normal. Judgment and thought content normal.   Nursing note and vitals reviewed.       MDM  Number of Diagnoses or Management Options  Chronic pain of left knee:   Myalgia:   Diagnosis management comments: DDX: arthralgia, knee effusion, chronic knee pain, viral illness, UTI    Discharge Note:  4:25 PM  The patient is ready for discharge. The patient's signs, symptoms,  diagnosis, and discharge instruction have been discussed and the patient has conveyed their understanding. The patient is to follow up as recommended or return to the ER should their symptoms worsen. Plan has been discussed and the patient is in agreement. Pt advised she can take her next Norco in 1hr       Amount and/or Complexity of Data Reviewed  Clinical lab tests: ordered and reviewed  Tests in the radiology section of CPT??: ordered and reviewed      ED Course       Procedures

## 2016-02-13 ENCOUNTER — Emergency Department: Admit: 2016-02-14 | Payer: MEDICAID | Primary: Family Medicine

## 2016-02-13 DIAGNOSIS — S43402A Unspecified sprain of left shoulder joint, initial encounter: Secondary | ICD-10-CM

## 2016-02-13 NOTE — ED Provider Notes (Signed)
Patient is a 25 y.o. female presenting with fall. The history is provided by the patient.   Fall   The accident occurred 3 to 5 hours ago. Fall occurred: Pt reports her dog took off and she fell while dog was dragging her.  She fell from a height of ground level. She landed on concrete. There was no blood loss. The point of impact was the left shoulder, left elbow, left wrist and left knee. She was ambulatory at the scene. Pertinent negatives include no fever, no numbness, no abdominal pain, no nausea, no vomiting, no headaches, no extremity weakness, no loss of consciousness and no tingling. Associated symptoms comments: Denies hitting head and LOC. She has tried nothing for the symptoms.        Past Medical History:   Diagnosis Date   ??? Arthritis    ??? Asthma    ??? Ill-defined condition     Lupus   ??? Sickle cell crisis [facility])        Past Surgical History:   Procedure Laterality Date   ??? HX APPENDECTOMY     ??? HX CESAREAN SECTION     ??? HX GYN      D and C   ??? HX GYN      cyst removed from fallopian tube - laporoscopic   ??? HX GYN      laporoscopic - to find IUD   ??? HX ORTHOPAEDIC      left knee         History reviewed. No pertinent family history.    Social History     Social History   ??? Marital status: SINGLE     Spouse name: N/A   ??? Number of children: N/A   ??? Years of education: N/A     Occupational History   ??? Not on file.     Social History Main Topics   ??? Smoking status: Never Smoker   ??? Smokeless tobacco: Never Used   ??? Alcohol use Yes      Comment: occ   ??? Drug use: No   ??? Sexual activity: Yes     Partners: Female     Other Topics Concern   ??? Not on file     Social History Narrative         ALLERGIES: Latex    Review of Systems   Constitutional: Negative for chills and fever.   Eyes: Negative for photophobia and visual disturbance.   Respiratory: Negative for chest tightness and shortness of breath.    Cardiovascular: Negative for chest pain.    Gastrointestinal: Negative for abdominal pain, nausea and vomiting.   Genitourinary: Negative for flank pain.   Musculoskeletal: Negative for back pain, extremity weakness and myalgias.   Skin: Negative for color change, pallor, rash and wound.   Neurological: Negative for dizziness, tingling, loss of consciousness, weakness, light-headedness, numbness and headaches.   All other systems reviewed and are negative.      Vitals:    02/13/16 2025   BP: (!) 142/100   Pulse: 72   Resp: 20   Temp: 98.3 ??F (36.8 ??C)   SpO2: 98%   Weight: 81.6 kg (180 lb)   Height: 5\' 3"  (1.6 m)            Physical Exam   Constitutional: She is oriented to person, place, and time. She appears well-developed and well-nourished. No distress.   HENT:   Head: Normocephalic and atraumatic.   Eyes: Conjunctivae are  normal.   Cardiovascular: Normal rate, regular rhythm and normal heart sounds.    Pulmonary/Chest: Effort normal and breath sounds normal. No respiratory distress.   Abdominal: Soft. Bowel sounds are normal. She exhibits no distension. There is no tenderness. There is no rebound.   Musculoskeletal:        Left shoulder: She exhibits tenderness, bony tenderness and swelling. She exhibits normal range of motion, no pain, normal pulse and normal strength.        Left elbow: She exhibits swelling. She exhibits normal range of motion. Tenderness found. Medial epicondyle tenderness noted.        Left wrist: She exhibits tenderness, bony tenderness and swelling. She exhibits normal range of motion, no effusion, no crepitus, no deformity and no laceration.        Right hip: Normal.        Left hip: Normal.        Right knee: She exhibits swelling and bony tenderness. She exhibits normal range of motion, no effusion, no ecchymosis and no laceration. Tenderness found.        Left knee: She exhibits swelling. She exhibits normal range of motion, no effusion and no ecchymosis. Tenderness found. Medial joint line tenderness noted.         Cervical back: Normal.        Thoracic back: Normal.        Lumbar back: Normal.   Neurological: She is alert and oriented to person, place, and time.   Skin: Skin is warm. No rash noted. No erythema.   Psychiatric: She has a normal mood and affect. Her behavior is normal.   Nursing note and vitals reviewed.       MDM  Number of Diagnoses or Management Options  Diagnosis management comments: DDx: Knee Fracture vs Contusion vs Sprain, Shoulder Sprain vs Contusion vs Fracture, Elbow Fracture vs Sprain, Wrist Fracture vs Sprain vs Contusion       Amount and/or Complexity of Data Reviewed  Tests in the radiology section of CPT??: ordered and reviewed      ED Course       Procedures             LABORATORY TESTS:  Recent Results (from the past 12 hour(s))   HCG URINE, QL. - POC    Collection Time: 02/13/16  8:57 PM   Result Value Ref Range    Pregnancy test,urine (POC) NEGATIVE  NEG         IMAGING RESULTS:  XR KNEE LT 3 V   Final Result      XR KNEE RT 3 V   Final Result      XR ELBOW LT MIN 3 V   Final Result      XR WRIST LT AP/LAT/OBL MIN 3V   Final Result      XR SCAPULA LT   Final Result      XR SHOULDER LT AP/LAT MIN 2 V   Final Result          MEDICATIONS GIVEN:  Medications   HYDROcodone-acetaminophen (NORCO) 5-325 mg per tablet 1 Tab (1 Tab Oral Given 02/13/16 2142)       IMPRESSION:  1. Contusion of knee, unspecified laterality, initial encounter    2. Sprain of left shoulder, unspecified shoulder sprain type, initial encounter    3. Fall, initial encounter        PLAN:  1.   Current Discharge Medication List      START taking  these medications    Details   naproxen (NAPROSYN) 500 mg tablet Take 1 Tab by mouth two (2) times daily (with meals).  Qty: 20 Tab, Refills: 0           2.   Follow-up Information     Follow up With Details Comments Contact Info    [facility] [place] Schedule an appointment as soon as possible for a visit in 1 week As needed [address]  [address]  [place] [address]   [phone]        Return to ED if worse

## 2016-02-13 NOTE — ED Notes (Signed)
Patient discharged by provider.

## 2016-02-13 NOTE — ED Notes (Signed)
Patient has been instructed that they have been given norco #1 pill* which contains opioids, benzodiazepines, or other sedating drugs. Patient is aware that they  will need to refrain from driving or operating heavy machinery after taking this medication.  Patient also instructed that they need to avoid drinking alcohol and using other products containing opioids, benzodiazepines, or other sedating drugs.  Patient verbalized understanding.  Ride is waiting in lobby.

## 2016-02-13 NOTE — ED Notes (Signed)
Patient returned from x-ray. Patient is requesting pain medication, PA Beth notified.

## 2016-02-13 NOTE — ED Notes (Signed)
.  See Nursing Assessment      Emergency Department Nursing Plan of Care       The Nursing Plan of Care is developed from the Nursing assessment and Emergency Department Attending provider initial evaluation.  The plan of care may be reviewed in the ???ED Provider note???.    The Plan of Care was developed with the following considerations:   Patient / Family readiness to learn indicated AV:[id number]:verbalized understanding  Persons(s) to be included in education: patient  Barriers to Learning/Limitations:No    Signed     Baldo Ashiffany G Jackson, RN    02/13/2016   8:29 PM

## 2016-02-14 ENCOUNTER — Inpatient Hospital Stay
Admit: 2016-02-14 | Discharge: 2016-02-14 | Disposition: A | Discharge status: Home / Self Care | Payer: MEDICAID | Attending: Emergency Medicine

## 2016-02-14 LAB — HCG URINE, QL. - POC: Pregnancy test,urine (POC): NEGATIVE

## 2016-02-14 MED ORDER — NAPROXEN 500 MG TAB
500 mg | ORAL_TABLET | Freq: Two times a day (BID) | ORAL | 0 refills | Status: AC
Start: 2016-02-14 — End: ?

## 2016-02-14 MED ORDER — HYDROCODONE-ACETAMINOPHEN 5 MG-325 MG TAB
5-325 mg | ORAL | Status: AC
Start: 2016-02-14 — End: 2016-02-13
  Administered 2016-02-14: 02:00:00 via ORAL

## 2016-02-14 MED FILL — HYDROCODONE-ACETAMINOPHEN 5 MG-325 MG TAB: 5-325 mg | ORAL | Qty: 1

## 2017-01-27 ENCOUNTER — Encounter: Payer: Self-pay | Admitting: Emergency Medicine

## 2017-01-27 ENCOUNTER — Emergency Department
Admission: EM | Admit: 2017-01-27 | Discharge: 2017-01-27 | Disposition: A | Discharge status: Home / Self Care | Payer: MEDICAID | Attending: Emergency Medicine | Admitting: Emergency Medicine

## 2017-01-27 DIAGNOSIS — Z760 Encounter for issue of repeat prescription: Secondary | ICD-10-CM

## 2017-01-27 DIAGNOSIS — F419 Anxiety disorder, unspecified: Secondary | ICD-10-CM | POA: Diagnosis present

## 2017-01-27 DIAGNOSIS — M25562 Pain in left knee: Secondary | ICD-10-CM | POA: Diagnosis not present

## 2017-01-27 DIAGNOSIS — M797 Fibromyalgia: Secondary | ICD-10-CM | POA: Insufficient documentation

## 2017-01-27 DIAGNOSIS — M25561 Pain in right knee: Secondary | ICD-10-CM | POA: Diagnosis not present

## 2017-01-27 DIAGNOSIS — F17210 Nicotine dependence, cigarettes, uncomplicated: Secondary | ICD-10-CM | POA: Insufficient documentation

## 2017-01-27 HISTORY — DX: Fibromyalgia: M79.7

## 2017-01-27 HISTORY — DX: Anxiety disorder, unspecified: F41.9

## 2017-01-27 HISTORY — DX: Major depressive disorder, single episode, unspecified: F32.9

## 2017-01-27 HISTORY — DX: Depression, unspecified: F32.A

## 2017-01-27 MED ORDER — KETOROLAC TROMETHAMINE 30 MG/ML IJ SOLN: 30.0000 mg | Freq: Once | INTRAMUSCULAR | Status: DC

## 2017-01-27 MED ORDER — NORETHIN ACE-ETH ESTRAD-FE 1-20 MG-MCG PO TABS: 1.0000 | ORAL_TABLET | Freq: Every day | ORAL | 0 refills | Status: DC

## 2017-01-27 MED ORDER — OXYCODONE-ACETAMINOPHEN 5-325 MG PO TABS: 1.0000 | ORAL_TABLET | Freq: Four times a day (QID) | ORAL | 0 refills | Status: AC | PRN

## 2017-01-27 MED ORDER — HYDROXYZINE HCL 10 MG PO TABS: 10.0000 mg | ORAL_TABLET | Freq: Three times a day (TID) | ORAL | 0 refills | Status: DC | PRN

## 2017-01-27 MED ORDER — CYCLOBENZAPRINE HCL 5 MG PO TABS: 5.0000 mg | ORAL_TABLET | Freq: Every day | ORAL | 0 refills | Status: AC

## 2017-01-27 MED ORDER — DULOXETINE HCL 60 MG PO CPEP: 60.0000 mg | ORAL_CAPSULE | Freq: Every day | ORAL | 0 refills | Status: DC

## 2017-01-27 MED ORDER — DIAZEPAM 5 MG PO TABS: 5.0000 mg | ORAL_TABLET | Freq: Three times a day (TID) | ORAL | 0 refills | Status: AC | PRN

## 2017-01-27 MED ORDER — DIAZEPAM 5 MG PO TABS
10.0000 mg | ORAL_TABLET | Freq: Once | ORAL | Status: AC
  Administered 2017-01-27: 10 mg via ORAL
  Filled 2017-01-27: qty 2

## 2017-01-27 MED ORDER — SERTRALINE HCL 100 MG PO TABS: 100.0000 mg | ORAL_TABLET | Freq: Every day | ORAL | 0 refills | Status: DC

## 2017-01-27 MED ORDER — KETOROLAC TROMETHAMINE 30 MG/ML IJ SOLN
30.0000 mg | Freq: Once | INTRAMUSCULAR | Status: AC
  Administered 2017-01-27: 30 mg via INTRAMUSCULAR
  Filled 2017-01-27: qty 1

## 2017-01-27 MED ORDER — LIDOCAINE 5 % EX PTCH: 1.0000 | MEDICATED_PATCH | CUTANEOUS | 0 refills | Status: AC

## 2017-01-27 MED ORDER — METHYLPREDNISOLONE SODIUM SUCC 125 MG IJ SOLR
125.0000 mg | Freq: Once | INTRAMUSCULAR | Status: AC
  Administered 2017-01-27: 125 mg via INTRAMUSCULAR
  Filled 2017-01-27: qty 2

## 2017-01-27 NOTE — ED Provider Notes (Signed)
[facility] Emergency Department Provider Note  ____________________________________________  Time seen: Approximately 12:14 PM  I have reviewed the triage vital signs and the nursing notes.   HISTORY  Chief Complaint Anxiety and Medication Refill    HPI Maria Kramer is a 26 y.o. female with PMH of anxiety, depression, fibromyalgia that presents to the emergency department for medication refill. Patient was living in [place], then moved to [facility], and now has been here for 3 weeks. She has not yet found a primary care provider. She has been out of her medications for 1 week. She is mostly concerned about the birth control prescription but her anxiety has been significantly worse this week. She states that she does not smoke but her anxiety has been so bad this week that she started smoking. She takes pain medication for her knees, which she has had several surgeries on. She wears braces on both knees daily. She has been on her medications for years and has not had any difficulty with them. She brought a printout of all of her prescriptions from the last year. She is also here with her daughter, who needs her medications refilled too. No shortness of breath, chest pain, nausea, vomiting, abdominal pain.   Past Medical History:  Diagnosis Date  . Anxiety   . Depression   . Fibromyalgia     There are no active problems to display for this patient.   Past Surgical History:  Procedure Laterality Date  . KNEE SURGERY Left     Prior to Admission medications   Medication Sig Start Date End Date Taking? Authorizing Provider  cyclobenzaprine (FLEXERIL) 5 MG tablet Take 1 tablet (5 mg total) by mouth at bedtime. 01/27/17 02/03/17  Enid Derry, PA-C  diazepam (VALIUM) 5 MG tablet Take 1 tablet (5 mg total) by mouth every 8 (eight) hours as needed for anxiety. 01/27/17 01/27/18  Enid Derry, PA-C  DULoxetine (CYMBALTA) 60 MG capsule Take 1 capsule  (60 mg total) by mouth daily. 01/27/17   Enid Derry, PA-C  hydrOXYzine (ATARAX/VISTARIL) 10 MG tablet Take 1 tablet (10 mg total) by mouth 3 (three) times daily as needed. 01/27/17   Enid Derry, PA-C  lidocaine (LIDODERM) 5 % Place 1 patch onto the skin daily. Remove & Discard patch within 12 hours or as directed by MD 01/27/17 01/27/18  Enid Derry, PA-C  norethindrone-ethinyl estradiol (JUNEL FE,GILDESS FE,LOESTRIN FE) 1-20 MG-MCG tablet Take 1 tablet by mouth daily. 01/27/17 01/27/18  Enid Derry, PA-C  oxyCODONE-acetaminophen (ROXICET) 5-325 MG tablet Take 1 tablet by mouth every 6 (six) hours as needed. 01/27/17 01/30/17  Enid Derry, PA-C  sertraline (ZOLOFT) 100 MG tablet Take 1 tablet (100 mg total) by mouth daily. 01/27/17 01/27/18  Enid Derry, PA-C    Allergies Latex  No family history on file.  Social History Social History  Substance Use Topics  . Smoking status: Current Every Day Smoker    Packs/day: 0.20    Types: Cigarettes  . Smokeless tobacco: Not on file  . Alcohol use Not on file     Review of Systems  Cardiovascular: No chest pain. Respiratory: No SOB. Gastrointestinal: No abdominal pain.  No nausea, no vomiting.  Musculoskeletal: Negative for musculoskeletal pain. Skin: Negative for rash, abrasions, lacerations, ecchymosis. Neurological: Negative for headaches, numbness or tingling   ____________________________________________   PHYSICAL EXAM:  VITAL SIGNS: ED Triage Vitals  Enc Vitals Group     BP 01/27/17 1101 (!) 150/104  Pulse Rate 01/27/17 1101 83     Resp 01/27/17 1101 20     Temp 01/27/17 1101 98.4 F (36.9 C)     Temp Source 01/27/17 1101 Oral     SpO2 01/27/17 1101 100 %     Weight 01/27/17 1101 185 lb (83.9 kg)     Height 01/27/17 1101  (1.626 m)     Head Circumference --      Peak Flow --      Pain Score 01/27/17 1059 7     Pain Loc --      Pain Edu? --      Excl. in GC? --      Constitutional: Alert and  oriented. Anxious Eyes: Conjunctivae are normal. PERRL. EOMI. Head: Atraumatic. ENT:      Ears:      Nose: No congestion/rhinnorhea.      Mouth/Throat: Mucous membranes are moist.  Neck: No stridor.  Cardiovascular: Normal rate, regular rhythm.  Good peripheral circulation. Respiratory: Normal respiratory effort without tachypnea or retractions. Lungs CTAB. Good air entry to the bases with no decreased or absent breath sounds. Musculoskeletal: Full range of motion to all extremities. No gross deformities appreciated. Neurologic:  Normal speech and language. No gross focal neurologic deficits are appreciated.  Skin:  Skin is warm, dry and intact. No rash noted.  ____________________________________________   LABS (all labs ordered are listed, but only abnormal results are displayed)  Labs Reviewed - No data to display ____________________________________________  EKG   ____________________________________________  RADIOLOGY  No results found.  ____________________________________________    PROCEDURES  Procedure(s) performed:    Procedures    Medications  methylPREDNISolone sodium succinate (SOLU-MEDROL) 125 mg/2 mL injection 125 mg (125 mg Intramuscular Given 01/27/17 1315)  diazepam (VALIUM) tablet 10 mg (10 mg Oral Given 01/27/17 1309)  ketorolac (TORADOL) 30 MG/ML injection 30 mg (30 mg Intramuscular Given 01/27/17 1311)     ____________________________________________   INITIAL IMPRESSION / ASSESSMENT AND PLAN / ED COURSE  Pertinent labs & imaging results that were available during my care of the patient were reviewed by me and considered in my medical decision making (see chart for details).  Review of the [place] CSRS was performed in accordance of the [facility] prior to dispensing any controlled drugs.     Patient presented to the emergency department for medication refill. Patient recently moved to [facility] from [facility] and [name]. Patient  takes 3 controlled substances: Valium, Percocet, gabapentin. Patient was very anxious in the ER so I discussed with her that I will give her a very limited supply of Percocet and Valium but that she would need to establish care this week with a primary care provider. She has paperwork with her to set up an appointment with pain clinic as well. Resources for primary care in [facility] was given. I will refill her other prescriptions for 3 weeks. Patient is to follow up with PCP as directed. Patient is given ED precautions to return to the ED for any worsening or new symptoms.     ____________________________________________  FINAL CLINICAL IMPRESSION(S) / ED DIAGNOSES  Final diagnoses:  Medication refill  Anxiety  Pain in both knees, unspecified chronicity  Fibromyalgia      NEW MEDICATIONS STARTED DURING THIS VISIT:  Discharge Medication List as of 01/27/2017  1:20 PM    START taking these medications   Details  cyclobenzaprine (FLEXERIL) 5 MG tablet Take 1 tablet (5 mg total) by mouth at bedtime., Starting  Mon 01/27/2017, Until Mon 02/03/2017, Print    diazepam (VALIUM) 5 MG tablet Take 1 tablet (5 mg total) by mouth every 8 (eight) hours as needed for anxiety., Starting Mon 01/27/2017, Until Tue 01/27/2018, Print    DULoxetine (CYMBALTA) 60 MG capsule Take 1 capsule (60 mg total) by mouth daily., Starting Mon 01/27/2017, Print    hydrOXYzine (ATARAX/VISTARIL) 10 MG tablet Take 1 tablet (10 mg total) by mouth 3 (three) times daily as needed., Starting Mon 01/27/2017, Print    lidocaine (LIDODERM) 5 % Place 1 patch onto the skin daily. Remove & Discard patch within 12 hours or as directed by MD, Starting Mon 01/27/2017, Until Tue 01/27/2018, Print    norethindrone-ethinyl estradiol (JUNEL FE,GILDESS FE,LOESTRIN FE) 1-20 MG-MCG tablet Take 1 tablet by mouth daily., Starting Mon 01/27/2017, Until Tue 01/27/2018, Print    oxyCODONE-acetaminophen (ROXICET) 5-325 MG tablet Take 1 tablet by  mouth every 6 (six) hours as needed., Starting Mon 01/27/2017, Until Thu 01/30/2017, Print    sertraline (ZOLOFT) 100 MG tablet Take 1 tablet (100 mg total) by mouth daily., Starting Mon 01/27/2017, Until Tue 01/27/2018, Print            This chart was dictated using voice recognition software/Dragon. Despite best efforts to proofread, errors can occur which can change the meaning. Any change was purely unintentional.    Enid Derry, PA-C 01/28/17 0854    Pershing Proud Myra Rude, MD 01/28/17 [id number]

## 2017-01-27 NOTE — ED Notes (Signed)
Patient requested to see the PA again, stating that her knees hurt which make her anxiety worse. Enid Derry PA aware.

## 2017-01-27 NOTE — ED Notes (Signed)
See triage note  States she moved here from [place]  Has been out of her meds for about 1 week  Requesting meds to be refilled

## 2017-01-27 NOTE — ED Triage Notes (Signed)
New to area from [place], needs referral and refills for anxiety. States has been off anxiety meds for one week. Does have pain clinic referral.

## 2017-02-25 ENCOUNTER — Emergency Department
Admission: EM | Admit: 2017-02-25 | Discharge: 2017-02-25 | Disposition: A | Discharge status: Home / Self Care | Payer: Medicaid Other | Attending: Emergency Medicine | Admitting: Emergency Medicine

## 2017-02-25 ENCOUNTER — Emergency Department: Payer: Medicaid Other

## 2017-02-25 ENCOUNTER — Encounter: Payer: Self-pay | Admitting: *Deleted

## 2017-02-25 DIAGNOSIS — M25561 Pain in right knee: Secondary | ICD-10-CM | POA: Insufficient documentation

## 2017-02-25 DIAGNOSIS — F17210 Nicotine dependence, cigarettes, uncomplicated: Secondary | ICD-10-CM | POA: Insufficient documentation

## 2017-02-25 DIAGNOSIS — Z91040 Latex allergy status: Secondary | ICD-10-CM | POA: Insufficient documentation

## 2017-02-25 DIAGNOSIS — M25562 Pain in left knee: Secondary | ICD-10-CM | POA: Diagnosis present

## 2017-02-25 DIAGNOSIS — G8929 Other chronic pain: Secondary | ICD-10-CM

## 2017-02-25 DIAGNOSIS — Z760 Encounter for issue of repeat prescription: Secondary | ICD-10-CM | POA: Diagnosis not present

## 2017-02-25 DIAGNOSIS — Z79899 Other long term (current) drug therapy: Secondary | ICD-10-CM | POA: Insufficient documentation

## 2017-02-25 MED ORDER — NORETHIN ACE-ETH ESTRAD-FE 1-20 MG-MCG PO TABS: 1.0000 | ORAL_TABLET | Freq: Every day | ORAL | 0 refills | Status: DC

## 2017-02-25 MED ORDER — HYDROXYZINE HCL 10 MG PO TABS: 10.0000 mg | ORAL_TABLET | Freq: Three times a day (TID) | ORAL | 0 refills | Status: AC | PRN

## 2017-02-25 MED ORDER — DICLOFENAC SODIUM 1 % TD CREA: 1.0000 g | TOPICAL_CREAM | Freq: Every day | TRANSDERMAL | 0 refills | Status: DC | PRN

## 2017-02-25 MED ORDER — DULOXETINE HCL 60 MG PO CPEP: 60.0000 mg | ORAL_CAPSULE | Freq: Every day | ORAL | 0 refills | Status: DC

## 2017-02-25 MED ORDER — SERTRALINE HCL 100 MG PO TABS: 100.0000 mg | ORAL_TABLET | Freq: Every day | ORAL | 0 refills | Status: DC

## 2017-02-25 MED ORDER — IBUPROFEN 600 MG PO TABS: 600.0000 mg | ORAL_TABLET | Freq: Four times a day (QID) | ORAL | 0 refills | Status: DC | PRN

## 2017-02-25 NOTE — ED Notes (Signed)
AAOx3.  Skin warm and dry.  Ambulates with easy and steady gait.  Affordable healthcare for [facility] [place] [facility] booklet given to patient and teaching given.

## 2017-02-25 NOTE — ED Provider Notes (Signed)
[facility] Emergency Department Provider Note  ____________________________________________  Time seen: Approximately 8:46 AM  I have reviewed the triage vital signs and the nursing notes.   HISTORY  Chief Complaint Knee Pain and Medication Refill    HPI Maria Kramer is a 26 y.o. female that presents to the emergency department for evaluation of knee pain and for medication refill. Patient states that she fell last week because she tripped over her dog. She has chronic knee pain but has felt the left knee is swelling more after fall. She wears knee braces but was not wearing them when she fell. She has been applying diclofenac cream and taking ibuprofen. She moved here 1 month ago from [facility]. She is trying to get established with a primary care provider but states that her Medicaid is still in [place] and needs to be transferred over to [facility]. She was seen in this emergency department one month ago for medication refill and would like medications to be refilled again. She is not having any difficulties or concerns with medications. She denies shortness breath, chest pain, nausea, vomiting, abdominal pain, numbness, tingling.   Past Medical History:  Diagnosis Date  . Anxiety   . Depression   . Fibromyalgia     There are no active problems to display for this patient.   Past Surgical History:  Procedure Laterality Date  . KNEE SURGERY Left     Prior to Admission medications   Medication Sig Start Date End Date Taking? Authorizing Provider  diazepam (VALIUM) 5 MG tablet Take 1 tablet (5 mg total) by mouth every 8 (eight) hours as needed for anxiety. 01/27/17 01/27/18  Enid Derry, PA-C  Diclofenac Sodium 1 % CREA Place 1 g onto the skin daily as needed. 02/25/17   Enid Derry, PA-C  DULoxetine (CYMBALTA) 60 MG capsule Take 1 capsule (60 mg total) by mouth daily. 02/25/17   Enid Derry, PA-C  hydrOXYzine (ATARAX/VISTARIL) 10 MG  tablet Take 1 tablet (10 mg total) by mouth 3 (three) times daily as needed. 02/25/17   Enid Derry, PA-C  ibuprofen (ADVIL,MOTRIN) 600 MG tablet Take 1 tablet (600 mg total) by mouth every 6 (six) hours as needed. 02/25/17   Enid Derry, PA-C  lidocaine (LIDODERM) 5 % Place 1 patch onto the skin daily. Remove & Discard patch within 12 hours or as directed by MD 01/27/17 01/27/18  Enid Derry, PA-C  norethindrone-ethinyl estradiol (JUNEL FE,GILDESS FE,LOESTRIN FE) 1-20 MG-MCG tablet Take 1 tablet by mouth daily. 02/25/17 02/25/18  Enid Derry, PA-C  sertraline (ZOLOFT) 100 MG tablet Take 1 tablet (100 mg total) by mouth daily. 02/25/17 02/25/18  Enid Derry, PA-C    Allergies Latex  No family history on file.  Social History Social History  Substance Use Topics  . Smoking status: Current Every Day Smoker    Packs/day: 0.20    Types: Cigarettes  . Smokeless tobacco: Not on file  . Alcohol use Yes     Review of Systems  Constitutional: No fever/chills Cardiovascular: No chest pain. Respiratory: No SOB. Gastrointestinal: No abdominal pain.  No nausea, no vomiting.  Musculoskeletal: Positive for knee pain. Skin: Negative for rash, abrasions, lacerations, ecchymosis. Neurological: Negative for headaches, numbness or tingling   ____________________________________________   PHYSICAL EXAM:  VITAL SIGNS: ED Triage Vitals  Enc Vitals Group     BP 02/25/17 0819 (!) 134/99     Pulse Rate 02/25/17 0819 86     Resp 02/25/17 0819 18  Temp 02/25/17 0819 99 F (37.2 C)     Temp Source 02/25/17 0819 Oral     SpO2 02/25/17 0819 99 %     Weight --      Height --      Head Circumference --      Peak Flow --      Pain Score 02/25/17 0822 8     Pain Loc --      Pain Edu? --      Excl. in GC? --      Constitutional: Alert and oriented. Well appearing and in no acute distress. Eyes: Conjunctivae are normal. PERRL. EOMI. Head: Atraumatic. ENT:      Ears:       Nose: No congestion/rhinnorhea.      Mouth/Throat: Mucous membranes are moist.  Neck: No stridor.   Cardiovascular: Normal rate, regular rhythm.  Good peripheral circulation. Respiratory: Normal respiratory effort without tachypnea or retractions. Lungs CTAB. Good air entry to the bases with no decreased or absent breath sounds. Gastrointestinal: Bowel sounds 4 quadrants. Soft and nontender to palpation. No guarding or rigidity. No palpable masses. No distention.  Musculoskeletal: Full range of motion to all extremities. No gross deformities appreciated.Tenderness to palplation over left patella. No warmth or erythema. No visible swelling. Neurologic:  Normal speech and language. No gross focal neurologic deficits are appreciated.  Skin:  Skin is warm, dry and intact. Well-healed surgical scars over left knee.    ____________________________________________   LABS (all labs ordered are listed, but only abnormal results are displayed)  Labs Reviewed - No data to display ____________________________________________  EKG   ____________________________________________  RADIOLOGY Lexine Baton, personally viewed and evaluated these images (plain radiographs) as part of my medical decision making, as well as reviewing the written report by the radiologist.  Dg Knee Complete 4 Views Left  Result Date: 02/25/2017 CLINICAL DATA:  Knee pain secondary to a fall 5 days ago. EXAM: LEFT KNEE - COMPLETE 4+ VIEW COMPARISON:  None. FINDINGS: No evidence of fracture, dislocation, or joint effusion. Two screws in the proximal tibia. Postsurgical changes of the patella with slight marginal osteophyte formation on the patella. Soft tissues are unremarkable. IMPRESSION: No acute abnormalities. Electronically Signed   By: Francene Boyers M.D.   On: 02/25/2017 09:16    ____________________________________________    PROCEDURES  Procedure(s) performed:    Procedures    Medications - No data  to display   ____________________________________________   INITIAL IMPRESSION / ASSESSMENT AND PLAN / ED COURSE  Pertinent labs & imaging results that were available during my care of the patient were reviewed by me and considered in my medical decision making (see chart for details).  Review of the  [place]RS was performed in accordance of the [facility] prior to dispensing any controlled drugs.   Patient presented to the emergency department for evaluation of chronic knee pain and for medication refill. Vital signs and exam are reassuring. Knee x-ray negative for acute bony abnormalities. Medications were refilled in this emergency department one month ago by me. I informed patient that I would not be able to refill any controlled substances again. I will refill her other medications for another 3 weeks. Patient will be discharged home with prescriptions for diclofenac, Cymbalta, hydroxyzine, ibuprofen, Junel, Zoloft. Patient is to follow up with PCP as directed. Resources for [facility] was provided. Patient is given ED precautions to return to the ED for any worsening or new symptoms.     ____________________________________________  FINAL CLINICAL IMPRESSION(S) / ED DIAGNOSES  Final diagnoses:  Chronic pain of both knees  Medication refill      NEW MEDICATIONS STARTED DURING THIS VISIT:  Discharge Medication List as of 02/25/2017 10:04 AM    START taking these medications   Details  Diclofenac Sodium 1 % CREA Place 1 g onto the skin daily as needed., Starting Tue 02/25/2017, Print    ibuprofen (ADVIL,MOTRIN) 600 MG tablet Take 1 tablet (600 mg total) by mouth every 6 (six) hours as needed., Starting Tue 02/25/2017, Print            This chart was dictated using voice recognition software/Dragon. Despite best efforts to proofread, errors can occur which can change the meaning. Any change was purely unintentional.    Enid DerryWagner, Missie Gehrig, PA-C 02/25/17 1144     Sharman CheekStafford, Phillip, MD 02/27/17 [phone]

## 2017-02-25 NOTE — ED Notes (Signed)
Complaining of pain in both knees.  Hx of surgery X 2 to left knee.  Wears braces on both knees but left them off today.  Walks slowly.

## 2017-02-25 NOTE — ED Triage Notes (Signed)
Pt has chronic knee pain, pt reports her knees have been hurting more the last two days, pt has several medications she would like refilled, pt moved here recently and currently has no PCP

## 2017-02-25 NOTE — ED Notes (Signed)
Called into patient's room. Patient stating that she has her pill bottles with her and just wants medications refilled.  When asked if she has established primary care in this area yet, patient states she has not because she has only been in this area for a month and all of her physicians are located in [place].  [address] Wagner alerted.  Will continue to monitor.

## 2017-03-01 ENCOUNTER — Emergency Department
Admission: EM | Admit: 2017-03-01 | Discharge: 2017-03-01 | Disposition: A | Discharge status: Home / Self Care | Payer: Medicaid Other | Attending: Emergency Medicine | Admitting: Emergency Medicine

## 2017-03-01 ENCOUNTER — Encounter: Payer: Self-pay | Admitting: Emergency Medicine

## 2017-03-01 DIAGNOSIS — H18821 Corneal disorder due to contact lens, right eye: Secondary | ICD-10-CM | POA: Insufficient documentation

## 2017-03-01 DIAGNOSIS — H5711 Ocular pain, right eye: Secondary | ICD-10-CM | POA: Diagnosis present

## 2017-03-01 DIAGNOSIS — Z79899 Other long term (current) drug therapy: Secondary | ICD-10-CM | POA: Insufficient documentation

## 2017-03-01 DIAGNOSIS — F17210 Nicotine dependence, cigarettes, uncomplicated: Secondary | ICD-10-CM | POA: Insufficient documentation

## 2017-03-01 DIAGNOSIS — S0501XA Injury of conjunctiva and corneal abrasion without foreign body, right eye, initial encounter: Secondary | ICD-10-CM

## 2017-03-01 MED ORDER — IBUPROFEN 600 MG PO TABS
600.0000 mg | ORAL_TABLET | Freq: Once | ORAL | Status: AC
  Administered 2017-03-01: 600 mg via ORAL
  Filled 2017-03-01: qty 1

## 2017-03-01 MED ORDER — ACETAMINOPHEN 500 MG PO TABS
1000.0000 mg | ORAL_TABLET | Freq: Once | ORAL | Status: AC
  Administered 2017-03-01: 1000 mg via ORAL
  Filled 2017-03-01: qty 2

## 2017-03-01 MED ORDER — CIPROFLOXACIN HCL 0.3 % OP SOLN: 1.0000 [drp] | OPHTHALMIC | 0 refills | Status: AC

## 2017-03-01 MED ORDER — TETRACAINE HCL 0.5 % OP SOLN
2.0000 [drp] | Freq: Once | OPHTHALMIC | Status: AC
  Administered 2017-03-01: 2 [drp] via OPHTHALMIC
  Filled 2017-03-01: qty 4

## 2017-03-01 MED ORDER — FLUORESCEIN SODIUM 1 MG OP STRP
2.0000 | ORAL_STRIP | Freq: Once | OPHTHALMIC | Status: AC
  Administered 2017-03-01: 2 via OPHTHALMIC
  Filled 2017-03-01: qty 2

## 2017-03-01 MED ORDER — KETOROLAC TROMETHAMINE 0.4 % OP SOLN - NO CHARGE: 1.0000 [drp] | Freq: Four times a day (QID) | OPHTHALMIC | 0 refills | Status: AC

## 2017-03-01 NOTE — ED Provider Notes (Signed)
[facility] Emergency Department Provider Note  ____________________________________________   First MD Initiated Contact with Patient 03/01/17 0701     (approximate)  I have reviewed the triage vital signs and the nursing notes.   HISTORY  Chief Complaint Eye Pain    HPI Maria Kramer is a 26 y.o. female who self presents to the emergency department with moderate to severe right eye pain that began this morning when she awoke.  It all began last night when she fell asleep wearing her contact lenses.  When she awoke this morning she noted both of her eyes were dry and she took out her right contact lens sustained immediate severe constant aching discomfort that feels like dry sand in her right eye.    Past Medical History:  Diagnosis Date  . Anxiety   . Depression   . Fibromyalgia     There are no active problems to display for this patient.   Past Surgical History:  Procedure Laterality Date  . KNEE SURGERY Left     Prior to Admission medications   Medication Sig Start Date End Date Taking? Authorizing Provider  ciprofloxacin (CILOXAN) 0.3 % ophthalmic solution Place 1 drop into the right eye every 2 (two) hours. Administer 1 drop, every 2 hours, while awake, for 2 days. Then 1 drop, every 4 hours, while awake, for the next 5 days. 03/01/17 03/06/17  Merrily Brittleifenbark, Dandre Sisler, MD  diazepam (VALIUM) 5 MG tablet Take 1 tablet (5 mg total) by mouth every 8 (eight) hours as needed for anxiety. 01/27/17 01/27/18  Enid DerryWagner, Ashley, PA-C  Diclofenac Sodium 1 % CREA Place 1 g onto the skin daily as needed. 02/25/17   Enid DerryWagner, Ashley, PA-C  DULoxetine (CYMBALTA) 60 MG capsule Take 1 capsule (60 mg total) by mouth daily. 02/25/17   Enid DerryWagner, Ashley, PA-C  hydrOXYzine (ATARAX/VISTARIL) 10 MG tablet Take 1 tablet (10 mg total) by mouth 3 (three) times daily as needed. 02/25/17   Enid DerryWagner, Ashley, PA-C  ibuprofen (ADVIL,MOTRIN) 600 MG tablet Take 1 tablet (600 mg total) by mouth  every 6 (six) hours as needed. 02/25/17   Enid DerryWagner, Ashley, PA-C  ketorolac (ACULAR LS) 0.4 % SOLN Place 1 drop into the right eye 4 (four) times daily. 03/01/17   Merrily Brittleifenbark, Jaquari Reckner, MD  lidocaine (LIDODERM) 5 % Place 1 patch onto the skin daily. Remove & Discard patch within 12 hours or as directed by MD 01/27/17 01/27/18  Enid DerryWagner, Ashley, PA-C  norethindrone-ethinyl estradiol (JUNEL FE,GILDESS FE,LOESTRIN FE) 1-20 MG-MCG tablet Take 1 tablet by mouth daily. 02/25/17 02/25/18  Enid DerryWagner, Ashley, PA-C  sertraline (ZOLOFT) 100 MG tablet Take 1 tablet (100 mg total) by mouth daily. 02/25/17 02/25/18  Enid DerryWagner, Ashley, PA-C    Allergies Latex  No family history on file.  Social History Social History  Substance Use Topics  . Smoking status: Current Every Day Smoker    Packs/day: 0.20    Types: Cigarettes  . Smokeless tobacco: Never Used  . Alcohol use Yes     Comment: occasionally    Review of Systems Constitutional: No fever/chills ENT: No sore throat. Cardiovascular: Denies chest pain. Respiratory: Denies shortness of breath. Gastrointestinal: No abdominal pain.  No nausea, no vomiting.  No diarrhea.  No constipation. Musculoskeletal: Negative for back pain. Neurological: Negative for headaches   ____________________________________________   PHYSICAL EXAM:  VITAL SIGNS: ED Triage Vitals  Enc Vitals Group     BP 03/01/17 0553 (!) 140/94     Pulse Rate 03/01/17 0553  87     Resp 03/01/17 0553 18     Temp 03/01/17 0553 98.4 F (36.9 C)     Temp Source 03/01/17 0553 Oral     SpO2 03/01/17 0553 99 %     Weight 03/01/17 0554 182 lb (82.6 kg)     Height 03/01/17 0554 5\' 4"  (1.626 m)     Head Circumference --      Peak Flow --      Pain Score 03/01/17 0552 9     Pain Loc --      Pain Edu? --      Excl. in GC? --     Constitutional: Alert and oriented x4 appears uncomfortable nontoxic no diaphoresis speaks full clear sentences Head: Atraumatic. Eyes: Pupils equal round reactive  midrange bilaterally extraocular motions intact Pain resolved after tetracaine infusion and right eye has medial corneal abrasion Nose: No congestion/rhinnorhea. Mouth/Throat: No trismus Neck: No stridor.   Respiratory: Normal respiratory effort.  No retractions. Neurologic:  Normal speech and language. No gross focal neurologic deficits are appreciated.  Skin:  Skin is warm, dry and intact. No rash noted.    ____________________________________________  LABS (all labs ordered are listed, but only abnormal results are displayed)  Labs Reviewed - No data to display   __________________________________________  EKG   ____________________________________________  RADIOLOGY   ____________________________________________   DIFFERENTIAL includes but not limited to  Corneal abrasion, corneal ulcer, glaucoma   PROCEDURES  Procedure(s) performed: no  Procedures  Critical Care performed: no  Observation: no ____________________________________________   INITIAL IMPRESSION / ASSESSMENT AND PLAN / ED COURSE  Pertinent labs & imaging results that were available during my care of the patient were reviewed by me and considered in my medical decision making (see chart for details).       ----------------------------------------- 8:11 AM on 03/01/2017 -----------------------------------------  After tetracaine the patient's pain is nearly completely resolved.  On fluorescein exam she has an abrasion versus an early ulcer on the medial aspect out of her visual axis.  We will cover her with fluoroquinolone eyedrops given her use of contact lenses and refer her to ophthalmology on Monday.  The patient verbalizes understanding and agreement with plan. ____________________________________________   FINAL CLINICAL IMPRESSION(S) / ED DIAGNOSES  Final diagnoses:  Abrasion of right cornea, initial encounter      NEW MEDICATIONS STARTED DURING THIS VISIT:  Discharge  Medication List as of 03/01/2017  8:09 AM    START taking these medications   Details  ciprofloxacin (CILOXAN) 0.3 % ophthalmic solution Place 1 drop into the right eye every 2 (two) hours. Administer 1 drop, every 2 hours, while awake, for 2 days. Then 1 drop, every 4 hours, while awake, for the next 5 days., Starting Sat 03/01/2017, Until Thu 03/06/2017, Print    ketorolac (ACULAR LS) 0.4 % SOLN Place 1 drop into the right eye 4 (four) times daily., Starting Sat 03/01/2017, Print         Note:  This document was prepared using Dragon voice recognition software and may include unintentional dictation errors.      Merrily Brittle, MD 03/01/17 [phone]

## 2017-03-01 NOTE — ED Triage Notes (Signed)
Pt arrives POV to ED with c/o eye pain and HA. Pt states that she fell asleep in her contacts and that started everything. Pt is slurring speech in triage and is in NAD.

## 2017-03-01 NOTE — Discharge Instructions (Signed)
DO NOT WEAR CONTACT LENSES UNTIL YOU HAVE BEEN SEEN BY THE EYE DOCTOR.  Please follow-up with the eye doctor this coming Monday for reevaluation and return to the emergency department sooner for any concerns.  It was a pleasure to take care of you today, and thank you for coming to our emergency department.  If you have any questions or concerns before leaving please ask the nurse to grab me and I'm more than happy to go through your aftercare instructions again.  If you were prescribed any opioid pain medication today such as Norco, Vicodin, Percocet, morphine, hydrocodone, or oxycodone please make sure you do not drive when you are taking this medication as it can alter your ability to drive safely.  If you have any concerns once you are home that you are not improving or are in fact getting worse before you can make it to your follow-up appointment, please do not hesitate to call 911 and come back for further evaluation.  Merrily BrittleNeil Osmel Dykstra, MD  No results found for this or any previous visit. Dg Knee Complete 4 Views Left  Result Date: 02/25/2017 CLINICAL DATA:  Knee pain secondary to a fall 5 days ago. EXAM: LEFT KNEE - COMPLETE 4+ VIEW COMPARISON:  None. FINDINGS: No evidence of fracture, dislocation, or joint effusion. Two screws in the proximal tibia. Postsurgical changes of the patella with slight marginal osteophyte formation on the patella. Soft tissues are unremarkable. IMPRESSION: No acute abnormalities. Electronically Signed   By: Francene BoyersJames  Maxwell M.D.   On: 02/25/2017 09:16

## 2017-06-22 ENCOUNTER — Other Ambulatory Visit: Payer: Self-pay

## 2017-06-22 ENCOUNTER — Emergency Department
Admission: EM | Admit: 2017-06-22 | Discharge: 2017-06-23 | Disposition: A | Discharge status: Home / Self Care | Payer: Medicaid Other | Attending: Emergency Medicine | Admitting: Emergency Medicine

## 2017-06-22 ENCOUNTER — Emergency Department: Payer: Medicaid Other

## 2017-06-22 DIAGNOSIS — S82831A Other fracture of upper and lower end of right fibula, initial encounter for closed fracture: Secondary | ICD-10-CM | POA: Insufficient documentation

## 2017-06-22 DIAGNOSIS — Y999 Unspecified external cause status: Secondary | ICD-10-CM | POA: Insufficient documentation

## 2017-06-22 DIAGNOSIS — S8251XA Displaced fracture of medial malleolus of right tibia, initial encounter for closed fracture: Secondary | ICD-10-CM

## 2017-06-22 DIAGNOSIS — S82891A Other fracture of right lower leg, initial encounter for closed fracture: Secondary | ICD-10-CM

## 2017-06-22 DIAGNOSIS — Y9301 Activity, walking, marching and hiking: Secondary | ICD-10-CM | POA: Insufficient documentation

## 2017-06-22 DIAGNOSIS — F17210 Nicotine dependence, cigarettes, uncomplicated: Secondary | ICD-10-CM | POA: Insufficient documentation

## 2017-06-22 DIAGNOSIS — W010XXA Fall on same level from slipping, tripping and stumbling without subsequent striking against object, initial encounter: Secondary | ICD-10-CM | POA: Insufficient documentation

## 2017-06-22 DIAGNOSIS — S82301A Unspecified fracture of lower end of right tibia, initial encounter for closed fracture: Secondary | ICD-10-CM | POA: Insufficient documentation

## 2017-06-22 DIAGNOSIS — Z79899 Other long term (current) drug therapy: Secondary | ICD-10-CM | POA: Insufficient documentation

## 2017-06-22 DIAGNOSIS — Y929 Unspecified place or not applicable: Secondary | ICD-10-CM | POA: Insufficient documentation

## 2017-06-22 MED ORDER — ONDANSETRON HCL 4 MG/2ML IJ SOLN
4.0000 mg | INTRAMUSCULAR | Status: AC
  Administered 2017-06-22: 4 mg via INTRAVENOUS
  Filled 2017-06-22: qty 2

## 2017-06-22 MED ORDER — MORPHINE SULFATE (PF) 4 MG/ML IV SOLN
4.0000 mg | Freq: Once | INTRAVENOUS | Status: AC
  Administered 2017-06-22: 4 mg via INTRAVENOUS
  Filled 2017-06-22: qty 1

## 2017-06-22 NOTE — ED Triage Notes (Signed)
Patient was going to car and fell down an embankment and injured right leg ankle ankle with some swelling and deformity noted. Patient had surgery on left knee last year and injured that on a car door a couple days away. Patient given of fentanyl IV.

## 2017-06-23 MED ORDER — SERTRALINE HCL 100 MG PO TABS: 100.0000 mg | ORAL_TABLET | Freq: Every day | ORAL | 1 refills | Status: AC

## 2017-06-23 MED ORDER — OXYCODONE-ACETAMINOPHEN 5-325 MG PO TABS: 1.0000 | ORAL_TABLET | Freq: Four times a day (QID) | ORAL | 0 refills | Status: AC | PRN

## 2017-06-23 MED ORDER — LORAZEPAM 2 MG/ML IJ SOLN
1.0000 mg | Freq: Once | INTRAMUSCULAR | Status: AC
  Administered 2017-06-23: 1 mg via INTRAVENOUS
  Filled 2017-06-23: qty 1

## 2017-06-23 MED ORDER — IBUPROFEN 600 MG PO TABS: 600.0000 mg | ORAL_TABLET | Freq: Four times a day (QID) | ORAL | 1 refills | Status: DC | PRN

## 2017-06-23 MED ORDER — OXYCODONE-ACETAMINOPHEN 5-325 MG PO TABS
2.0000 | ORAL_TABLET | Freq: Once | ORAL | Status: AC
  Administered 2017-06-23: 2 via ORAL
  Filled 2017-06-23: qty 2

## 2017-06-23 MED ORDER — DULOXETINE HCL 60 MG PO CPEP: 60.0000 mg | ORAL_CAPSULE | Freq: Every day | ORAL | 1 refills | Status: AC

## 2017-06-23 MED ORDER — HYDROMORPHONE HCL 1 MG/ML IJ SOLN
1.0000 mg | INTRAMUSCULAR | Status: AC
  Administered 2017-06-23: 1 mg via INTRAVENOUS
  Filled 2017-06-23: qty 1

## 2017-06-23 MED ORDER — DICLOFENAC SODIUM 1 % TD CREA: 1.0000 g | TOPICAL_CREAM | Freq: Every day | TRANSDERMAL | 1 refills | Status: AC | PRN

## 2017-06-23 MED ORDER — NORETHIN ACE-ETH ESTRAD-FE 1-20 MG-MCG PO TABS: 1.0000 | ORAL_TABLET | Freq: Every day | ORAL | 1 refills | Status: AC

## 2017-06-23 NOTE — ED Notes (Signed)
Pt refusing crutches.  States she has crutches at home and a walker.  She cant use crutches because her other leg hurts and she cant bear weight.

## 2017-06-23 NOTE — ED Notes (Signed)
Pt assisted with bed pan

## 2017-06-23 NOTE — ED Notes (Signed)
Patient is asking for refill for some of her prescriptions. MD made aware.

## 2017-06-23 NOTE — ED Notes (Signed)
[place] is going [place] [facility] [place] [employer] [place] and stated that her boyfriend will be able to help her in when she gets home if cab driver will knock on door when arrived.
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
## 2017-06-23 NOTE — Discharge Instructions (Signed)
Please read through the included information about splint care (keep it clean and dry).  If your pain becomes much more severe, the splint becomes too tight, or you feel as if your injured limb is becoming numb or cold, please return immediately to the Emergency Department.  Follow up with the orthopedics specialist listed in this paperwork.  When possible, keep your splint elevated to help with the swelling.  You may also use ice packs over the splint.  Take Percocet as prescribed for severe pain. Do not drink alcohol, drive or participate in any other potentially dangerous activities while taking this medication as it may make you sleepy. Do not take this medication with any other sedating medications, either prescription or over-the-counter. If you were prescribed Percocet or Vicodin, do not take these with acetaminophen (Tylenol) as it is already contained within these medications.   This medication is an opiate (or narcotic) pain medication and can be habit forming.  Use it as little as possible to achieve adequate pain control.  Do not use or use it with extreme caution if you have a history of opiate abuse or dependence.  If you are on a pain contract with your primary care doctor or a pain specialist, be sure to let them know you were prescribed this medication today from the [facility].  This medication is intended for your use only - do not give any to anyone else and keep it in a secure place where nobody else, especially children, have access to it.  It will also cause or worsen constipation, so you may want to consider taking an over-the-counter stool softener while you are taking this medication.

## 2017-07-14 ENCOUNTER — Encounter: Payer: Self-pay | Admitting: Emergency Medicine

## 2017-07-14 ENCOUNTER — Emergency Department
Admission: EM | Admit: 2017-07-14 | Discharge: 2017-07-14 | Disposition: A | Discharge status: Home / Self Care | Payer: Medicaid Other | Attending: Emergency Medicine | Admitting: Emergency Medicine

## 2017-07-14 ENCOUNTER — Other Ambulatory Visit: Payer: Self-pay

## 2017-07-14 DIAGNOSIS — Z79899 Other long term (current) drug therapy: Secondary | ICD-10-CM | POA: Insufficient documentation

## 2017-07-14 DIAGNOSIS — Y998 Other external cause status: Secondary | ICD-10-CM | POA: Insufficient documentation

## 2017-07-14 DIAGNOSIS — F329 Major depressive disorder, single episode, unspecified: Secondary | ICD-10-CM | POA: Insufficient documentation

## 2017-07-14 DIAGNOSIS — S161XXA Strain of muscle, fascia and tendon at neck level, initial encounter: Secondary | ICD-10-CM

## 2017-07-14 DIAGNOSIS — F17210 Nicotine dependence, cigarettes, uncomplicated: Secondary | ICD-10-CM | POA: Insufficient documentation

## 2017-07-14 DIAGNOSIS — F419 Anxiety disorder, unspecified: Secondary | ICD-10-CM | POA: Insufficient documentation

## 2017-07-14 DIAGNOSIS — Y92410 Unspecified street and highway as the place of occurrence of the external cause: Secondary | ICD-10-CM | POA: Insufficient documentation

## 2017-07-14 DIAGNOSIS — Z91040 Latex allergy status: Secondary | ICD-10-CM | POA: Insufficient documentation

## 2017-07-14 DIAGNOSIS — Y9389 Activity, other specified: Secondary | ICD-10-CM | POA: Insufficient documentation

## 2017-07-14 MED ORDER — IBUPROFEN 600 MG PO TABS: 600.0000 mg | ORAL_TABLET | Freq: Four times a day (QID) | ORAL | 1 refills | Status: AC | PRN

## 2017-07-14 MED ORDER — GABAPENTIN 300 MG PO CAPS: 300.0000 mg | ORAL_CAPSULE | Freq: Three times a day (TID) | ORAL | 0 refills | Status: AC
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
## 2017-07-14 NOTE — Discharge Instructions (Signed)
Follow-up with one the clinics listed on your discharge papers.  You will need to call and make an appointment.  Continue taking your gabapentin as directed and ibuprofen.  Use ice or warm moist compresses to your neck as needed.  Continue following up with the orthopedist that you are saying about your foot.
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
## 2019-04-07 ENCOUNTER — Emergency Department: Admit: 2019-04-07 | Payer: MEDICARE | Primary: Family Medicine

## 2019-04-07 ENCOUNTER — Inpatient Hospital Stay
Admit: 2019-04-07 | Discharge: 2019-04-07 | Disposition: A | Discharge status: Other Acute Inpatient Hospital | Payer: MEDICARE | Attending: Emergency Medicine

## 2019-04-07 DIAGNOSIS — S0083XA Contusion of other part of head, initial encounter: Secondary | ICD-10-CM

## 2019-04-07 MED ORDER — IBUPROFEN 400 MG TAB
400 mg | ORAL | Status: AC
Start: 2019-04-07 — End: 2019-04-07
  Administered 2019-04-07: 22:00:00 via ORAL

## 2019-04-07 MED ORDER — IBUPROFEN 800 MG TAB
800 mg | ORAL_TABLET | Freq: Three times a day (TID) | ORAL | 0 refills | Status: AC | PRN
Start: 2019-04-07 — End: 2019-04-14

## 2019-04-07 MED ORDER — LORAZEPAM 1 MG TAB
1 mg | ORAL | Status: AC
Start: 2019-04-07 — End: 2019-04-07
  Administered 2019-04-07: 20:00:00 via ORAL

## 2019-04-07 MED FILL — IBUPROFEN 400 MG TAB: 400 mg | ORAL | Qty: 2

## 2019-04-07 MED FILL — LORAZEPAM 1 MG TAB: 1 mg | ORAL | Qty: 1
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
## 2019-04-07 NOTE — ED Notes (Signed)
Pt to xray via wheelchair. [employer] with patient.
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
## 2019-04-07 NOTE — ED Notes (Signed)
Patient (s)  given copy of dc instructions and 0 paper script(s) and 1 electronic scripts.  Patient (s)  verbalized understanding of instructions and script (s).  Patient given a current medication reconciliation form and verbalized understanding of their medications.   Patient (s) verbalized understanding of the importance of discussing medications with  his or her physician or clinic they will be following up with.  Patient alert and oriented and in no acute distress.  Patient offered wheelchair from treatment area to hospital entrance, patient exited ED via wheelchair.

## 2019-04-07 NOTE — ED Notes (Signed)
Report called to Sedonia Small, nurse at [facility]. Discharge papers printed for facility and patient.
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
## 2019-04-07 NOTE — ED Notes (Signed)
Report called to Susan Moore, nurse at [facility]. Discharge papers printed for facility and patient.

## 2019-04-07 NOTE — ED Notes (Signed)
Pt to xray via wheelchair. [employer] with patient.

## 2019-04-07 NOTE — ED Notes (Signed)
Patient (s)  given copy of dc instructions and 0 paper script(s) and 1 electronic scripts.  Patient (s)  verbalized understanding of instructions and script (s).  Patient given a current medication reconciliation form and verbalized understanding of their medications.   Patient (s) verbalized understanding of the importance of discussing medications with  his or her physician or clinic they will be following up with.  Patient alert and oriented and in no acute distress.  Patient offered wheelchair from treatment area to hospital entrance, patient exited ED via wheelchair.
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
## 2019-05-04 IMAGING — DX DG KNEE COMPLETE 4+V*L*
4 series · 4 of 4 positions shown · non-contrast
Comparison: Radiograph 02/25/2017

CLINICAL DATA: Left knee pain after fall.  Prior surgery.

EXAM:
LEFT KNEE - COMPLETE 4+ VIEW

[knee ap]
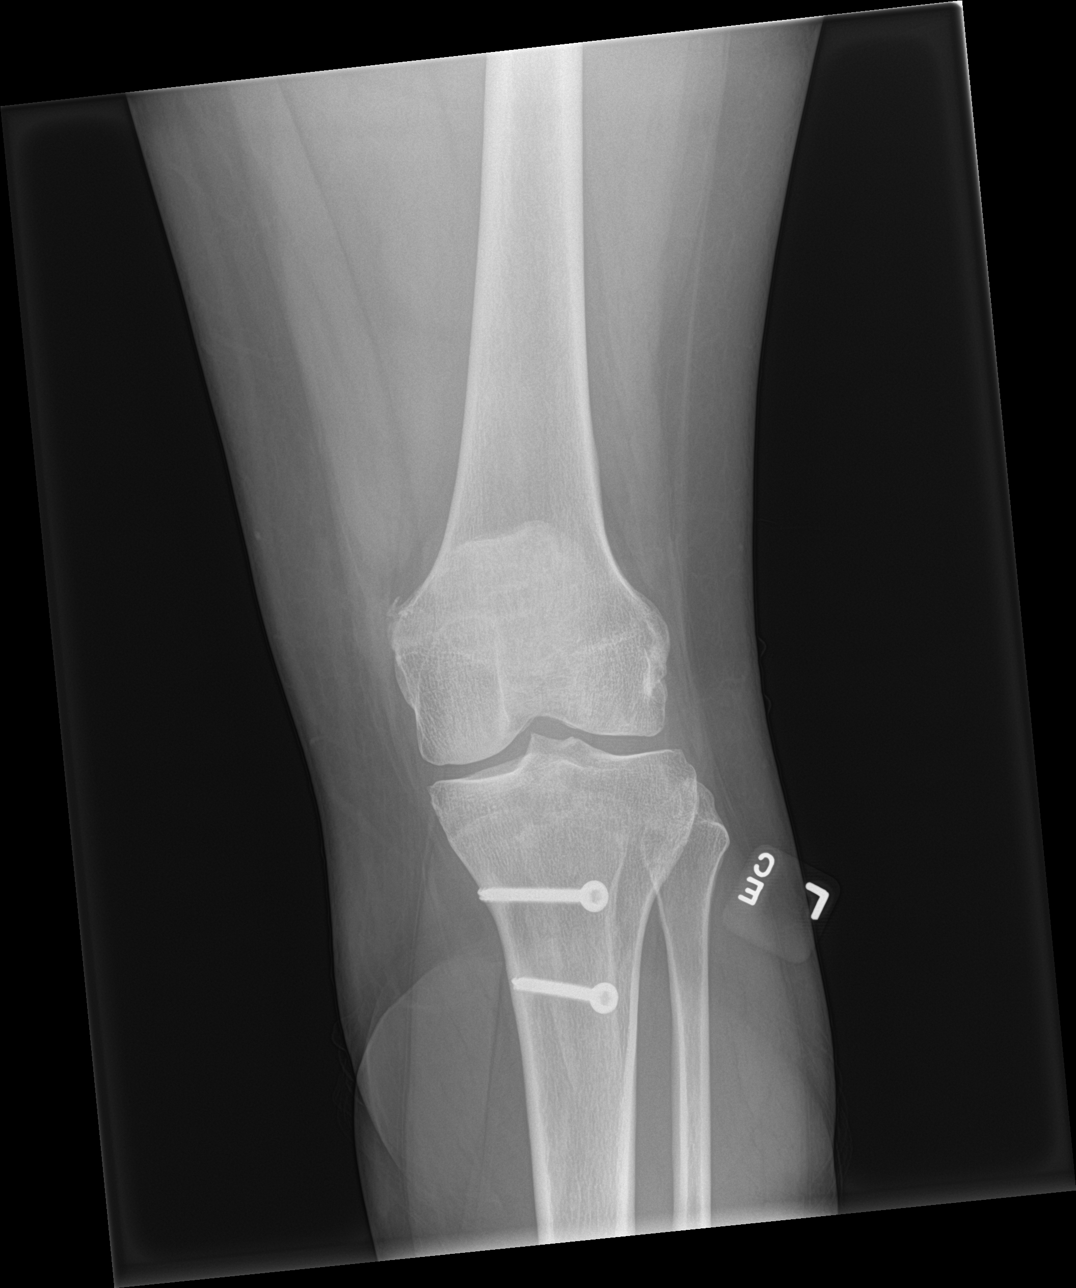

[knee tunnel]
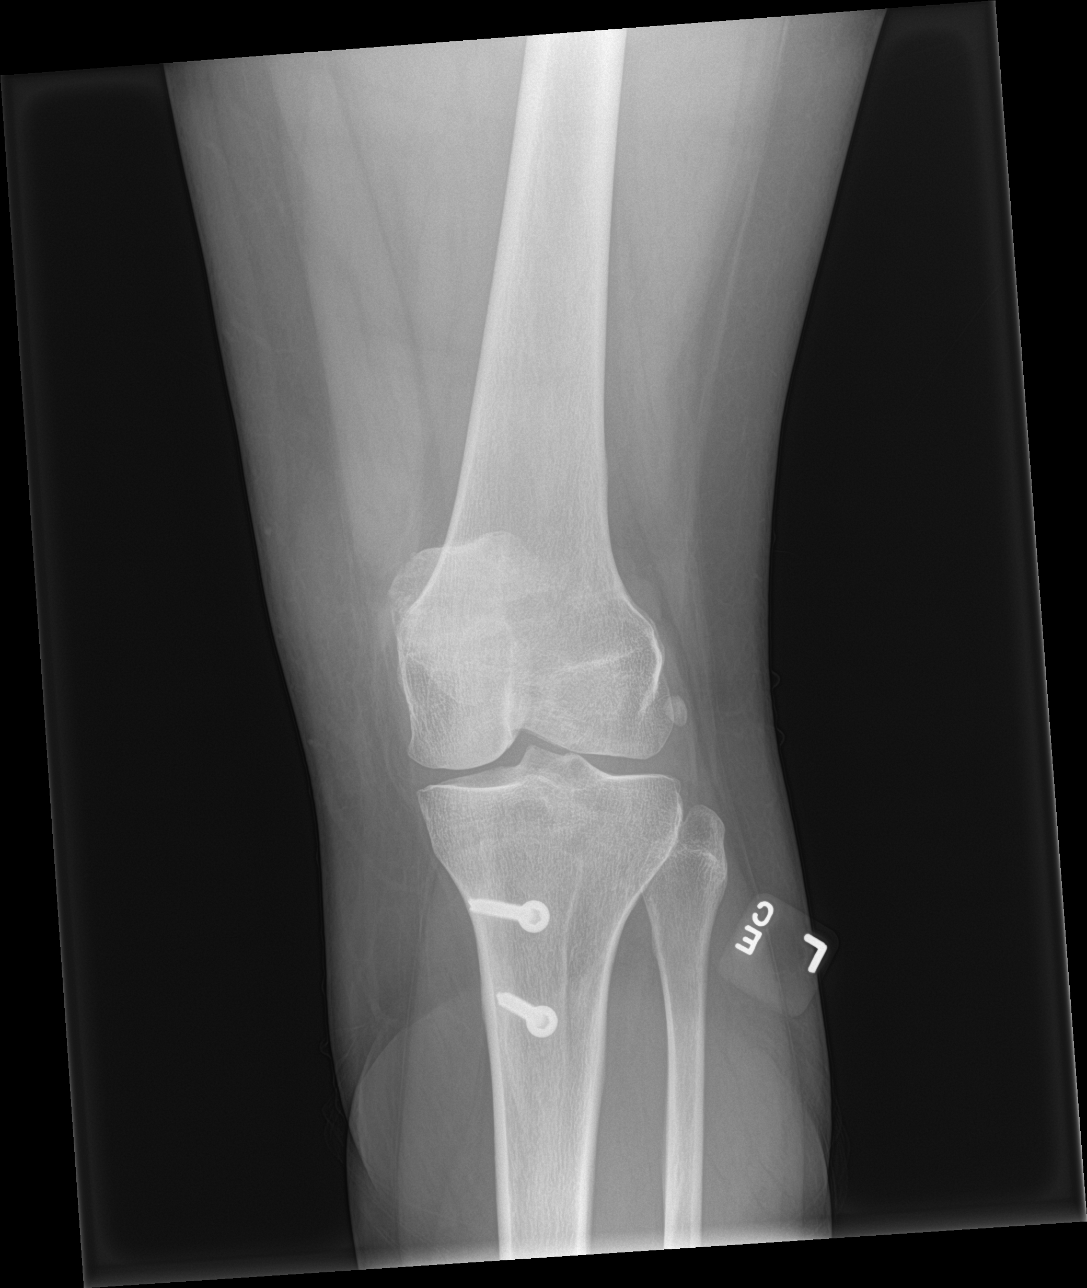

[knee lat]
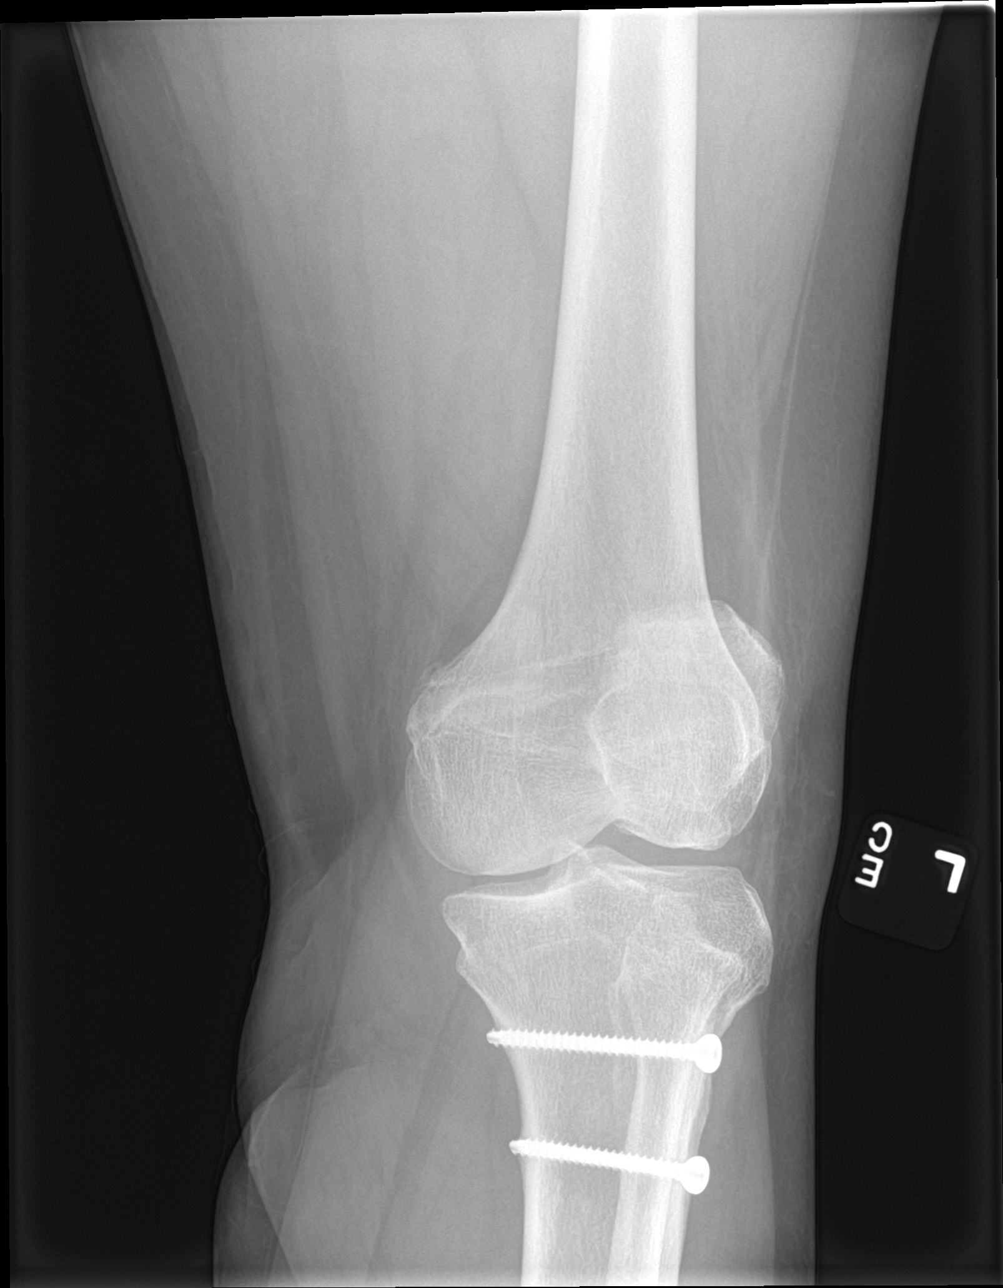

[patella skyline]
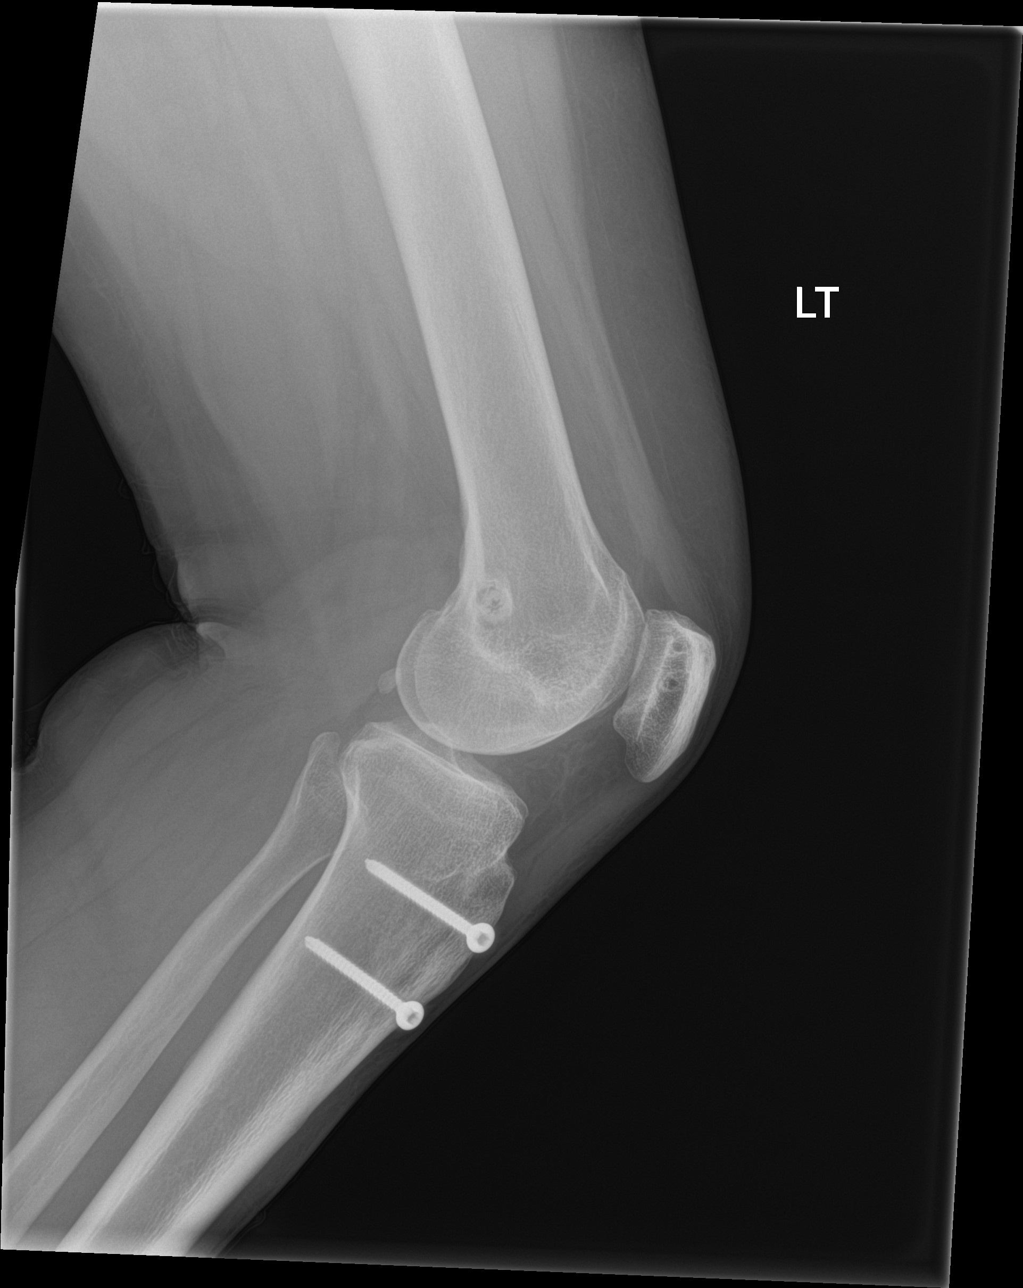

[4 of 4 positions shown; findings below may reference images not displayed]

FINDINGS: Two screws in the proximal tibia. No acute fracture or dislocation.
No knee joint effusion. Ghost tracks in the distal femur and
patella. Minimal peripheral spurring of the medial tibiofemoral
compartment.
IMPRESSION: No acute abnormality.

## 2019-08-07 ENCOUNTER — Emergency Department: Admit: 2019-08-07 | Payer: MEDICARE | Primary: Family Medicine

## 2019-08-07 ENCOUNTER — Inpatient Hospital Stay
Admit: 2019-08-07 | Discharge: 2019-08-08 | Disposition: A | Discharge status: Home / Self Care | Payer: MEDICARE | Attending: Emergency Medicine

## 2019-08-07 DIAGNOSIS — F1410 Cocaine abuse, uncomplicated: Secondary | ICD-10-CM

## 2019-08-07 LAB — SALICYLATE
Salicylate level: 2.3 MG/DL — ABNORMAL LOW (ref 2.8–20.0)
Salicylate: 2.3 MG/DL — ABNORMAL LOW (ref 2.8–20.0)

## 2019-08-07 LAB — HEPATIC FUNCTION PANEL
A-G Ratio: 1 (ref 0.8–1.7)
ALT (SGPT): 21 U/L (ref 13–56)
ALT: 21 U/L (ref 13–56)
AST (SGOT): 22 U/L (ref 10–38)
AST: 22 U/L (ref 10–38)
Albumin/Globulin Ratio: 1 (ref 0.8–1.7)
Albumin: 4.2 g/dL (ref 3.4–5.0)
Albumin: 4.2 g/dL (ref 3.4–5.0)
Alk. phosphatase: 94 U/L (ref 45–117)
Alkaline Phosphatase: 94 U/L (ref 45–117)
Bilirubin, Direct: 0.1 MG/DL (ref 0.0–0.2)
Bilirubin, direct: 0.1 MG/DL (ref 0.0–0.2)
Bilirubin, total: 0.3 MG/DL (ref 0.2–1.0)
Globulin: 4.2 g/dL — ABNORMAL HIGH (ref 2.0–4.0)
Globulin: 4.2 g/dL — ABNORMAL HIGH (ref 2.0–4.0)
Protein, total: 8.4 g/dL — ABNORMAL HIGH (ref 6.4–8.2)
Total Bilirubin: 0.3 MG/DL (ref 0.2–1.0)
Total Protein: 8.4 g/dL — ABNORMAL HIGH (ref 6.4–8.2)

## 2019-08-07 LAB — CBC WITH AUTO DIFFERENTIAL
Basophils %: 1 % (ref 0–2)
Basophils Absolute: 0.1 10*3/uL (ref 0.0–0.1)
Eosinophils %: 6 % — ABNORMAL HIGH (ref 0–5)
Eosinophils Absolute: 0.6 10*3/uL — ABNORMAL HIGH (ref 0.0–0.4)
Hematocrit: 38.6 % (ref 35.0–45.0)
Hemoglobin: 13.7 g/dL (ref 12.0–16.0)
Lymphocytes %: 38 % (ref 21–52)
Lymphocytes Absolute: 4.1 10*3/uL — ABNORMAL HIGH (ref 0.9–3.6)
MCH: 29.1 PG (ref 24.0–34.0)
MCHC: 35.5 g/dL (ref 31.0–37.0)
MCV: 82.1 FL (ref 74.0–97.0)
MPV: 9.4 FL (ref 9.2–11.8)
Monocytes %: 9 % (ref 3–10)
Monocytes Absolute: 1 10*3/uL (ref 0.05–1.2)
Neutrophils %: 46 % (ref 40–73)
Neutrophils Absolute: 5.1 10*3/uL (ref 1.8–8.0)
Platelets: 364 10*3/uL (ref 135–420)
RBC: 4.7 M/uL (ref 4.20–5.30)
RDW: 13.3 % (ref 11.6–14.5)
WBC: 10.8 10*3/uL (ref 4.6–13.2)

## 2019-08-07 LAB — DRUG SCREEN, URINE
AMPHETAMINES: NEGATIVE
Amphetamine Screen, Urine: NEGATIVE
BARBITURATES: NEGATIVE
BENZODIAZEPINES: NEGATIVE
Barbiturate Screen, Urine: NEGATIVE
Benzodiazepine Screen, Urine: NEGATIVE
COCAINE: POSITIVE — AB
Cocaine Screen Urine: POSITIVE — AB
METHADONE: NEGATIVE
Methadone Screen, Urine: NEGATIVE
OPIATES: NEGATIVE
Opiate Screen, Urine: NEGATIVE
PCP Screen, Urine: NEGATIVE
PCP(PHENCYCLIDINE): NEGATIVE
THC (TH-CANNABINOL): POSITIVE — AB
THC Screen, Urine: POSITIVE — AB

## 2019-08-07 LAB — BASIC METABOLIC PANEL
Anion Gap: 8 mmol/L (ref 3.0–18)
BUN: 4 MG/DL — ABNORMAL LOW (ref 7.0–18)
Bun/Cre Ratio: 4 — ABNORMAL LOW (ref 12–20)
CO2: 25 mmol/L (ref 21–32)
Calcium: 8.8 MG/DL (ref 8.5–10.1)
Chloride: 103 mmol/L (ref 100–111)
Creatinine: 0.91 MG/DL (ref 0.6–1.3)
EGFR IF NonAfrican American: >60 mL/min/{1.73_m2} (ref >60)
GFR African American: >60 mL/min/{1.73_m2} (ref >60)
Glucose: 70 mg/dL — ABNORMAL LOW (ref 74–99)
Potassium: 3.8 mmol/L (ref 3.5–5.5)
Sodium: 136 mmol/L (ref 136–145)

## 2019-08-07 LAB — HCG QL SERUM
HCG, Ql.: NEGATIVE
HCG, Ql.: NEGATIVE

## 2019-08-07 LAB — ACETAMINOPHEN LEVEL: Acetaminophen Level: <2 ug/mL — ABNORMAL LOW (ref 10.0–30.0)

## 2019-08-07 LAB — ETHYL ALCOHOL
ALCOHOL(ETHYL),SERUM: <3 MG/DL (ref 0–3)
Ethyl Alcohol: <3 MG/DL (ref 0–3)

## 2019-08-07 LAB — METABOLIC PANEL, BASIC
Anion gap: 8 mmol/L (ref 3.0–18)
BUN/Creatinine ratio: 4 — ABNORMAL LOW (ref 12–20)
BUN: 4 MG/DL — ABNORMAL LOW (ref 7.0–18)
CO2: 25 mmol/L (ref 21–32)
Calcium: 8.8 MG/DL (ref 8.5–10.1)
Chloride: 103 mmol/L (ref 100–111)
Creatinine: 0.91 MG/DL (ref 0.6–1.3)
GFR est AA: >60 mL/min/{1.73_m2} (ref >60)
GFR est non-AA: >60 mL/min/{1.73_m2} (ref >60)
Glucose: 70 mg/dL — ABNORMAL LOW (ref 74–99)
Potassium: 3.8 mmol/L (ref 3.5–5.5)
Sodium: 136 mmol/L (ref 136–145)

## 2019-08-07 LAB — CBC WITH AUTOMATED DIFF
ABS. BASOPHILS: 0.1 10*3/uL (ref 0.0–0.1)
ABS. EOSINOPHILS: 0.6 10*3/uL — ABNORMAL HIGH (ref 0.0–0.4)
ABS. LYMPHOCYTES: 4.1 10*3/uL — ABNORMAL HIGH (ref 0.9–3.6)
ABS. MONOCYTES: 1 10*3/uL (ref 0.05–1.2)
ABS. NEUTROPHILS: 5.1 10*3/uL (ref 1.8–8.0)
BASOPHILS: 1 % (ref 0–2)
EOSINOPHILS: 6 % — ABNORMAL HIGH (ref 0–5)
HCT: 38.6 % (ref 35.0–45.0)
HGB: 13.7 g/dL (ref 12.0–16.0)
LYMPHOCYTES: 38 % (ref 21–52)
MCH: 29.1 PG (ref 24.0–34.0)
MCHC: 35.5 g/dL (ref 31.0–37.0)
MCV: 82.1 FL (ref 74.0–97.0)
MONOCYTES: 9 % (ref 3–10)
MPV: 9.4 FL (ref 9.2–11.8)
NEUTROPHILS: 46 % (ref 40–73)
PLATELET: 364 10*3/uL (ref 135–420)
RBC: 4.7 M/uL (ref 4.20–5.30)
RDW: 13.3 % (ref 11.6–14.5)
WBC: 10.8 10*3/uL (ref 4.6–13.2)

## 2019-08-07 LAB — ACETAMINOPHEN: Acetaminophen level: <2 ug/mL — ABNORMAL LOW (ref 10.0–30.0)

## 2019-08-07 MED ORDER — NALOXONE 0.4 MG/ML INJECTION
0.4 mg/mL | Freq: Once | INTRAMUSCULAR | Status: AC
Start: 2019-08-07 — End: 2019-08-07
  Administered 2019-08-07: 21:00:00 via INTRAVENOUS

## 2019-08-07 MED ORDER — DEXTROSE 50% IN WATER (D50W) IV SYRG
INTRAVENOUS | Status: DC
Start: 2019-08-07 — End: 2019-08-07

## 2019-08-07 MED ORDER — SODIUM CHLORIDE 0.9% BOLUS IV
0.9 % | Freq: Once | INTRAVENOUS | Status: DC
Start: 2019-08-07 — End: 2019-08-07

## 2019-08-07 MED FILL — SODIUM CHLORIDE 0.9 % IV: INTRAVENOUS | Qty: 1000

## 2019-08-07 MED FILL — NALOXONE 0.4 MG/ML INJECTION: 0.4 mg/mL | INTRAMUSCULAR | Qty: 1
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
## 2019-08-07 NOTE — ED Notes (Signed)
Patient currently awake and requesting something to eat.

## 2019-08-07 NOTE — ED Notes (Signed)
Patient received from [place], [place]. Patient currently eating dinner. Will continue to monitor.

## 2019-08-07 NOTE — ED Notes (Signed)
Patient ambulated to bathroom without difficulty.

## 2019-08-07 NOTE — ED Provider Notes (Signed)
EMERGENCY DEPARTMENT HISTORY AND PHYSICAL EXAM    2:01 PM      Date: 08/07/2019  Patient Name: Tasha Jones    History of Presenting Illness     Chief Complaint   Patient presents with   ??? Altered mental status         History Provided By: Patient    Additional History (Context): Tasha Jones is a 29 y.o. female with Past medical history of asthma, arthritis, sickle cell disease, who presents with chief complaint per EMS of altered mental status.  EMS states that they found patient on the steps of hotel with alcohol on her breath.  HPI otherwise limited.  Patient arrived and is somnolent but responds to painful stimulus and moves arms equally.    PCP: Other, Phys, MD        Past History     Past Medical History:  Past Medical History:   Diagnosis Date   ??? Arthritis    ??? Asthma    ??? Ill-defined condition     Lupus   ??? Sickle cell crisis [facility])        Past Surgical History:  Past Surgical History:   Procedure Laterality Date   ??? HX APPENDECTOMY     ??? HX CESAREAN SECTION     ??? HX GYN      D and C   ??? HX GYN      cyst removed from fallopian tube - laporoscopic   ??? HX GYN      laporoscopic - to find IUD   ??? HX ORTHOPAEDIC      left knee       Family History:  No family history on file.    Social History:  Social History     Tobacco Use   ??? Smoking status: Never Smoker   ??? Smokeless tobacco: Never Used   Substance Use Topics   ??? Alcohol use: Yes     Comment: occ   ??? Drug use: No       Allergies:  Allergies   Allergen Reactions   ??? Latex Hives         Review of Systems       Review of Systems   Unable to perform ROS: Acuity of condition         Physical Exam     Visit Vitals  BP (!) 134/90   Pulse 69   Temp 97.6 ??F (36.4 ??C)   Resp 17   SpO2 95%         Physical Exam  Vitals signs and nursing note reviewed.   Constitutional:       General: She is not in acute distress.     Appearance: She is well-developed.   HENT:      Head: Normocephalic.      Comments: Dried blood on the left lower lip  No dental injury, no loose teeth  noted  No tongue laceration is noted     Nose: Nose normal.   Eyes:      General: No scleral icterus.     Conjunctiva/sclera: Conjunctivae normal.      Pupils: Pupils are equal, round, and reactive to light.      Comments: Both are 3 mm's and reactive   Neck:      Musculoskeletal: Normal range of motion and neck supple. No neck rigidity.   Cardiovascular:      Rate and Rhythm: Normal rate.      Heart sounds: Normal  heart sounds.      Comments: Capillary refill < 3 seconds  Pulmonary:      Effort: Pulmonary effort is normal. No respiratory distress.      Breath sounds: Normal breath sounds. No wheezing.   Abdominal:      General: Bowel sounds are normal. There is no distension.      Palpations: Abdomen is soft.      Tenderness: There is no abdominal tenderness.   Musculoskeletal:         General: No deformity.      Right lower leg: No edema.      Left lower leg: No edema.   Skin:     General: Skin is warm and dry.      Coloration: Skin is not jaundiced or pale.   Neurological:      Comments: Somnolent  Response with pain stimulus such as sternal rub and move both of her arms to reach to her sternum, equal strength UE's  Yells out when tach attempting to get IV  No hyperreflexia  No myoclonus  Pupils are 3 mm and reactive   Psychiatric:      Comments: Unable to access           Diagnostic Study Results     Labs -  Recent Results (from the past 12 hour(s))   CBC WITH AUTOMATED DIFF    Collection Time: 08/07/19  2:20 PM   Result Value Ref Range    WBC 10.8 4.6 - 13.2 K/uL    RBC 4.70 4.20 - 5.30 M/uL    HGB 13.7 12.0 - 16.0 g/dL    HCT 38.6 35.0 - 45.0 %    MCV 82.1 74.0 - 97.0 FL    MCH 29.1 24.0 - 34.0 PG    MCHC 35.5 31.0 - 37.0 g/dL    RDW 13.3 11.6 - 14.5 %    PLATELET 364 135 - 420 K/uL    MPV 9.4 9.2 - 11.8 FL    NEUTROPHILS 46 40 - 73 %    LYMPHOCYTES 38 21 - 52 %    MONOCYTES 9 3 - 10 %    EOSINOPHILS 6 (H) 0 - 5 %    BASOPHILS 1 0 - 2 %    ABS. NEUTROPHILS 5.1 1.8 - 8.0 K/UL    ABS. LYMPHOCYTES 4.1 (H) 0.9 -  3.6 K/UL    ABS. MONOCYTES 1.0 0.05 - 1.2 K/UL    ABS. EOSINOPHILS 0.6 (H) 0.0 - 0.4 K/UL    ABS. BASOPHILS 0.1 0.0 - 0.1 K/UL    DF AUTOMATED     METABOLIC PANEL, BASIC    Collection Time: 08/07/19  2:20 PM   Result Value Ref Range    Sodium 136 136 - 145 mmol/L    Potassium 3.8 3.5 - 5.5 mmol/L    Chloride 103 100 - 111 mmol/L    CO2 25 21 - 32 mmol/L    Anion gap 8 3.0 - 18 mmol/L    Glucose 70 (L) 74 - 99 mg/dL    BUN 4 (L) 7.0 - 18 MG/DL    Creatinine 0.91 0.6 - 1.3 MG/DL    BUN/Creatinine ratio 4 (L) 12 - 20      GFR est AA >60 >60 ml/min/1.85m    GFR est non-AA >60 >60 ml/min/1.767m   Calcium 8.8 8.5 - 10.1 MG/DL   ETHYL ALCOHOL    Collection Time: 08/07/19  2:20 PM   Result Value Ref Range  ALCOHOL(ETHYL),SERUM <3 0 - 3 MG/DL   HEPATIC FUNCTION PANEL    Collection Time: 08/07/19  2:20 PM   Result Value Ref Range    Protein, total 8.4 (H) 6.4 - 8.2 g/dL    Albumin 4.2 3.4 - 5.0 g/dL    Globulin 4.2 (H) 2.0 - 4.0 g/dL    A-G Ratio 1.0 0.8 - 1.7      Bilirubin, total 0.3 0.2 - 1.0 MG/DL    Bilirubin, direct 0.1 0.0 - 0.2 MG/DL    Alk. phosphatase 94 45 - 117 U/L    AST (SGOT) 22 10 - 38 U/L    ALT (SGPT) 21 13 - 56 U/L   ACETAMINOPHEN    Collection Time: 08/07/19  2:20 PM   Result Value Ref Range    Acetaminophen level <2 (L) 10.0 - 30.0 ug/mL   HCG QL SERUM    Collection Time: 08/07/19  2:20 PM   Result Value Ref Range    HCG, Ql. Negative NEG     SALICYLATE    Collection Time: 08/07/19  2:31 PM   Result Value Ref Range    Salicylate level 2.3 (L) 2.8 - 20.0 MG/DL   DRUG SCREEN, URINE    Collection Time: 08/07/19  6:20 PM   Result Value Ref Range    BENZODIAZEPINES Negative NEG      BARBITURATES Negative NEG      THC (TH-CANNABINOL) Positive (A) NEG      OPIATES Negative NEG      PCP(PHENCYCLIDINE) Negative NEG      COCAINE Positive (A) NEG      AMPHETAMINES Negative NEG      METHADONE Negative NEG      HDSCOM (NOTE)        Radiologic Studies -   CT HEAD WO CONT   Final Result   No CT evidence for acute  intracranial process..         XR CHEST PORT    (Results Pending)   XR KNEE LT 3 V    (Results Pending)   Chest x-ray per my preliminary read no acute process  Blake Divine DO    X-rays left knee per my preliminary read: No fracture no dislocation  Blake Divine DO      Medical Decision Making   I am the first provider for this patient.    I reviewed the vital signs, available nursing notes, past medical history, past surgical history, family history and social history.    Vital Signs-Reviewed the patient's vital signs.    Pulse Oximetry Analysis -  100% on room air (Interpretation) normal    Cardiac Monitor:  Rate: 68  Rhythm:  Normal Sinus Rhythm         Records Reviewed: Nursing Notes and Old Medical Records (Time of Review: 2:01 PM)    Provider Notes (Medical Decision Making): DDx: Intoxication, metabolic, brain bleed, drug overdose    Check labs, CT brain, give Narcan    MDM    Medications   naloxone (NARCAN) injection 0.4 mg (0.4 mg IntraVENous Given 08/07/19 1723)           ED Course: Progress Notes, Reevaluation, and Consults:  Narcan gave no response.  Patient still somnolent but moving all extremities with symmetric strength she has her knees both flexed towards her stomach and arms holding them.    She is in no apparent distress.  I suspect some form of intoxication.  Waiting for urine    CT  brain shows nothing acute    6:10 PM  Patient is now awake and alert, talking and asking for crackers and drink.  She admits that she smokes crack and has been smoking marijuana when she apparently passed out after doing that.  She states that she has been using illicit drugs for about 2 years and she is asking for help with detox.  Not suicidal homicidal.    She did complain of left knee pain and thinks she may have banged it.  Has some tenderness in the anterior knee, has full range of motion of the knee, no laxity, no swelling, no calf tenderness no other musculoskeletal tenderness.  She denies any dizziness, headache,  neck pain or numbness.  States she is currently on her menses.  Does have post surgical scars which are old.  We will get a x-ray of her knee.    7:23 PM  I spoke with Mateo Flow from crisis regarding patient and case.  She states that she will come down and evaluate patient.    10:05 PM : Pt care transferred to Dr. Oletta Lamas  ,ED provider. History of patient complaint(s), available diagnostic reports and current treatment plan has been discussed thoroughly. .  Intended disposition of patient : TBD  Pending diagnostics reports and/or labs (please list):     Dr. Loney Loh assistance in completion of this plan is greatly appreciated but it should be noted that I will be the provider of record for this patient.    Diagnosis     Clinical Impression:   1. Cocaine abuse ([place])    2. Marijuana smoker        Disposition: pending crisis eval     Follow-up Information    None          Patient's Medications   Start Taking    No medications on file   Continue Taking    CYCLOBENZAPRINE (FLEXERIL) 10 MG TABLET    Take  by mouth three (3) times daily as needed for Muscle Spasm(s).    ERGOCALCIFEROL (VITAMIN D2) 50,000 UNIT CAPSULE    Take 50,000 Units by mouth.    GABAPENTIN (NEURONTIN) 100 MG CAPSULE    Take  by mouth three (3) times daily. Unknown dose.    GLUCOSAMINE (GLUCOSAMINE RELIEF) 1,000 MG TAB    Take  by mouth.    HYDROCODONE-ACETAMINOPHEN (NORCO) 5-325 MG PER TABLET    Take  by mouth.    NAPROXEN (NAPROSYN) 500 MG TABLET    Take 1 Tab by mouth two (2) times daily (with meals).    SERTRALINE (ZOLOFT) 100 MG TABLET    Take  by mouth daily.   These Medications have changed    No medications on file   Stop Taking    No medications on file         Blake Divine, DO    Dragon medical dictation software was used for portions of this report.   Unintended transcription errors may occur.     My signature above authenticates this document and my orders, the final    diagnosis (es), discharge prescription (s), and instructions in the Epic     record.

## 2019-08-07 NOTE — ED Notes (Signed)
Patient ambulated to bathroom without difficulty

## 2019-08-07 NOTE — ED Notes (Signed)
Patient currently awake and requesting something to eat.

## 2019-08-07 NOTE — ED Triage Notes (Signed)
Per medic: "[facility] staff called stating that patient was intoxicated and sleeping on there steps. Upon arrival to the ER patient continue to be unresponsive to verbal stimuli.

## 2019-08-07 NOTE — ED Provider Notes (Signed)
EMERGENCY DEPARTMENT HISTORY AND PHYSICAL EXAM    2:01 PM      Date: 08/07/2019  Patient Name: Tasha Jones    History of Presenting Illness     Chief Complaint   Patient presents with   ??? Altered mental status         History Provided By: Patient    Additional History (Context): Tasha Jones is a 29 y.o. female with Past medical history of asthma, arthritis, sickle cell disease, who presents with chief complaint per EMS of altered mental status.  EMS states that they found patient on the steps of hotel with alcohol on her breath.  HPI otherwise limited.  Patient arrived and is somnolent but responds to painful stimulus and moves arms equally.    PCP: Other, Phys, MD        Past History     Past Medical History:  Past Medical History:   Diagnosis Date   ??? Arthritis    ??? Asthma    ??? Ill-defined condition     Lupus   ??? Sickle cell crisis [facility])        Past Surgical History:  Past Surgical History:   Procedure Laterality Date   ??? HX APPENDECTOMY     ??? HX CESAREAN SECTION     ??? HX GYN      D and C   ??? HX GYN      cyst removed from fallopian tube - laporoscopic   ??? HX GYN      laporoscopic - to find IUD   ??? HX ORTHOPAEDIC      left knee       Family History:  No family history on file.    Social History:  Social History     Tobacco Use   ??? Smoking status: Never Smoker   ??? Smokeless tobacco: Never Used   Substance Use Topics   ??? Alcohol use: Yes     Comment: occ   ??? Drug use: No       Allergies:  Allergies   Allergen Reactions   ??? Latex Hives         Review of Systems       Review of Systems   Unable to perform ROS: Acuity of condition         Physical Exam     Visit Vitals  BP (!) 134/90   Pulse 69   Temp 97.6 ??F (36.4 ??C)   Resp 17   SpO2 95%         Physical Exam  Vitals signs and nursing note reviewed.   Constitutional:       General: She is not in acute distress.     Appearance: She is well-developed.   HENT:      Head: Normocephalic.      Comments: Dried blood on the left lower lip  No dental injury, no loose teeth noted   No tongue laceration is noted     Nose: Nose normal.   Eyes:      General: No scleral icterus.     Conjunctiva/sclera: Conjunctivae normal.      Pupils: Pupils are equal, round, and reactive to light.      Comments: Both are 3 mm's and reactive   Neck:      Musculoskeletal: Normal range of motion and neck supple. No neck rigidity.   Cardiovascular:      Rate and Rhythm: Normal rate.      Heart sounds: Normal  heart sounds.      Comments: Capillary refill < 3 seconds  Pulmonary:      Effort: Pulmonary effort is normal. No respiratory distress.      Breath sounds: Normal breath sounds. No wheezing.   Abdominal:      General: Bowel sounds are normal. There is no distension.      Palpations: Abdomen is soft.      Tenderness: There is no abdominal tenderness.   Musculoskeletal:         General: No deformity.      Right lower leg: No edema.      Left lower leg: No edema.   Skin:     General: Skin is warm and dry.      Coloration: Skin is not jaundiced or pale.   Neurological:      Comments: Somnolent  Response with pain stimulus such as sternal rub and move both of her arms to reach to her sternum, equal strength UE's  Yells out when tach attempting to get IV  No hyperreflexia  No myoclonus  Pupils are 3 mm and reactive   Psychiatric:      Comments: Unable to access           Diagnostic Study Results     Labs -  Recent Results (from the past 12 hour(s))   CBC WITH AUTOMATED DIFF    Collection Time: 08/07/19  2:20 PM   Result Value Ref Range    WBC 10.8 4.6 - 13.2 K/uL    RBC 4.70 4.20 - 5.30 M/uL    HGB 13.7 12.0 - 16.0 g/dL    HCT 38.6 35.0 - 45.0 %    MCV 82.1 74.0 - 97.0 FL    MCH 29.1 24.0 - 34.0 PG    MCHC 35.5 31.0 - 37.0 g/dL    RDW 13.3 11.6 - 14.5 %    PLATELET 364 135 - 420 K/uL    MPV 9.4 9.2 - 11.8 FL    NEUTROPHILS 46 40 - 73 %    LYMPHOCYTES 38 21 - 52 %    MONOCYTES 9 3 - 10 %    EOSINOPHILS 6 (H) 0 - 5 %    BASOPHILS 1 0 - 2 %    ABS. NEUTROPHILS 5.1 1.8 - 8.0 K/UL    ABS. LYMPHOCYTES 4.1 (H) 0.9 - 3.6  K/UL    ABS. MONOCYTES 1.0 0.05 - 1.2 K/UL    ABS. EOSINOPHILS 0.6 (H) 0.0 - 0.4 K/UL    ABS. BASOPHILS 0.1 0.0 - 0.1 K/UL    DF AUTOMATED     METABOLIC PANEL, BASIC    Collection Time: 08/07/19  2:20 PM   Result Value Ref Range    Sodium 136 136 - 145 mmol/L    Potassium 3.8 3.5 - 5.5 mmol/L    Chloride 103 100 - 111 mmol/L    CO2 25 21 - 32 mmol/L    Anion gap 8 3.0 - 18 mmol/L    Glucose 70 (L) 74 - 99 mg/dL    BUN 4 (L) 7.0 - 18 MG/DL    Creatinine 0.91 0.6 - 1.3 MG/DL    BUN/Creatinine ratio 4 (L) 12 - 20      GFR est AA >60 >60 ml/min/1.72m    GFR est non-AA >60 >60 ml/min/1.720m   Calcium 8.8 8.5 - 10.1 MG/DL   ETHYL ALCOHOL    Collection Time: 08/07/19  2:20 PM   Result Value Ref Range  ALCOHOL(ETHYL),SERUM <3 0 - 3 MG/DL   HEPATIC FUNCTION PANEL    Collection Time: 08/07/19  2:20 PM   Result Value Ref Range    Protein, total 8.4 (H) 6.4 - 8.2 g/dL    Albumin 4.2 3.4 - 5.0 g/dL    Globulin 4.2 (H) 2.0 - 4.0 g/dL    A-G Ratio 1.0 0.8 - 1.7      Bilirubin, total 0.3 0.2 - 1.0 MG/DL    Bilirubin, direct 0.1 0.0 - 0.2 MG/DL    Alk. phosphatase 94 45 - 117 U/L    AST (SGOT) 22 10 - 38 U/L    ALT (SGPT) 21 13 - 56 U/L   ACETAMINOPHEN    Collection Time: 08/07/19  2:20 PM   Result Value Ref Range    Acetaminophen level <2 (L) 10.0 - 30.0 ug/mL   HCG QL SERUM    Collection Time: 08/07/19  2:20 PM   Result Value Ref Range    HCG, Ql. Negative NEG     SALICYLATE    Collection Time: 08/07/19  2:31 PM   Result Value Ref Range    Salicylate level 2.3 (L) 2.8 - 20.0 MG/DL   DRUG SCREEN, URINE    Collection Time: 08/07/19  6:20 PM   Result Value Ref Range    BENZODIAZEPINES Negative NEG      BARBITURATES Negative NEG      THC (TH-CANNABINOL) Positive (A) NEG      OPIATES Negative NEG      PCP(PHENCYCLIDINE) Negative NEG      COCAINE Positive (A) NEG      AMPHETAMINES Negative NEG      METHADONE Negative NEG      HDSCOM (NOTE)        Radiologic Studies -   CT HEAD WO CONT   Final Result   No CT evidence for acute  intracranial process..         XR CHEST PORT    (Results Pending)   XR KNEE LT 3 V    (Results Pending)   Chest x-ray per my preliminary read no acute process  Blake Divine DO    X-rays left knee per my preliminary read: No fracture no dislocation  Blake Divine DO      Medical Decision Making   I am the first provider for this patient.    I reviewed the vital signs, available nursing notes, past medical history, past surgical history, family history and social history.    Vital Signs-Reviewed the patient's vital signs.    Pulse Oximetry Analysis -  100% on room air (Interpretation) normal    Cardiac Monitor:  Rate: 68  Rhythm:  Normal Sinus Rhythm         Records Reviewed: Nursing Notes and Old Medical Records (Time of Review: 2:01 PM)    Provider Notes (Medical Decision Making): DDx: Intoxication, metabolic, brain bleed, drug overdose    Check labs, CT brain, give Narcan    MDM    Medications   naloxone (NARCAN) injection 0.4 mg (0.4 mg IntraVENous Given 08/07/19 1723)           ED Course: Progress Notes, Reevaluation, and Consults:  Narcan gave no response.  Patient still somnolent but moving all extremities with symmetric strength she has her knees both flexed towards her stomach and arms holding them.    She is in no apparent distress.  I suspect some form of intoxication.  Waiting for urine    CT  brain shows nothing acute    6:10 PM  Patient is now awake and alert, talking and asking for crackers and drink.  She admits that she smokes crack and has been smoking marijuana when she apparently passed out after doing that.  She states that she has been using illicit drugs for about 2 years and she is asking for help with detox.  Not suicidal homicidal.    She did complain of left knee pain and thinks she may have banged it.  Has some tenderness in the anterior knee, has full range of motion of the knee, no laxity, no swelling, no calf tenderness no other musculoskeletal tenderness.  She denies any dizziness, headache,  neck pain or numbness.  States she is currently on her menses.  Does have post surgical scars which are old.  We will get a x-ray of her knee.    7:23 PM  I spoke with Mateo Flow from crisis regarding patient and case.  She states that she will come down and evaluate patient.    10:05 PM : Pt care transferred to Dr. Oletta Lamas  ,ED provider. History of patient complaint(s), available diagnostic reports and current treatment plan has been discussed thoroughly. .  Intended disposition of patient : TBD  Pending diagnostics reports and/or labs (please list):     Dr. Loney Loh assistance in completion of this plan is greatly appreciated but it should be noted that I will be the provider of record for this patient.    Diagnosis     Clinical Impression:   1. Cocaine abuse ([place])    2. Marijuana smoker        Disposition: pending crisis eval     Follow-up Information    None          Patient's Medications   Start Taking    No medications on file   Continue Taking    CYCLOBENZAPRINE (FLEXERIL) 10 MG TABLET    Take  by mouth three (3) times daily as needed for Muscle Spasm(s).    ERGOCALCIFEROL (VITAMIN D2) 50,000 UNIT CAPSULE    Take 50,000 Units by mouth.    GABAPENTIN (NEURONTIN) 100 MG CAPSULE    Take  by mouth three (3) times daily. Unknown dose.    GLUCOSAMINE (GLUCOSAMINE RELIEF) 1,000 MG TAB    Take  by mouth.    HYDROCODONE-ACETAMINOPHEN (NORCO) 5-325 MG PER TABLET    Take  by mouth.    NAPROXEN (NAPROSYN) 500 MG TABLET    Take 1 Tab by mouth two (2) times daily (with meals).    SERTRALINE (ZOLOFT) 100 MG TABLET    Take  by mouth daily.   These Medications have changed    No medications on file   Stop Taking    No medications on file         Blake Divine, DO    Dragon medical dictation software was used for portions of this report.   Unintended transcription errors may occur.     My signature above authenticates this document and my orders, the final    diagnosis (es), discharge prescription (s), and instructions in the Epic     record.

## 2019-08-07 NOTE — ED Notes (Signed)
Patient received from Cheryl, RN. Patient currently eating dinner. Will continue to monitor.

## 2019-08-08 NOTE — ED Notes (Signed)
Addendum:    Patient was seen by the day team and was signed out to me pending crisis evaluation. Patient requesting help with cocaine abuse. Denies any SI or HI. Patient was a evaluated by Vikki Ports and cleared for discharge and close outpatient follow-up.    Evette Georges, MD

## 2019-08-08 NOTE — ED Notes (Signed)
I have reviewed discharge instructions with the patient.  The patient verbalized understanding.

## 2019-08-08 NOTE — ED Notes (Signed)
Addendum:    Patient was seen by the day team and was signed out to me pending crisis evaluation. Patient requesting help with cocaine abuse. Denies any SI or HI. Patient was a evaluated by Valerie and cleared for discharge and close outpatient follow-up.    Audry Pecina, MD

## 2019-08-08 NOTE — ED Notes (Signed)
I have reviewed discharge instructions with the patient.  The patient verbalized understanding.

## 2019-08-08 NOTE — Other (Signed)
Comprehensive Assessment     Integrated Summary    Dr. Kristeen Mans requested crisis evaluation for patient. Patient interviewed in FT/8.    Patient is a 29 year old female who stated that she would like help with "using drugs and housing." Patient verbalized that she is from [place] and she has been in [place] since February, 2021." Patient is alert and oriented x 4, calm, cooperative, pleasant. Patient denied thoughts, plans, or intentions of harm towards self or others, denied hallucinations. Patient says that her sleep and appetite varies, but she does not have disturbances with appetite or sleep. Patient said that she would like help with crack-cocaine and marijuana abuse. Patient added that she does not have a place to live in [place].    Mental Status Exam    The patient's appearance is unkempt.  The patient's behavior calm, cooperative. The patient is oriented to time, place, person and situation.  The patient's speech shows no evidence of impairment.  The patient's mood "mood is just want to sleep."  The range of affect is slightly drowsy. The patient's thought content demonstrates no evidence of impairment.  The thought process shows no evidence of impairment.  The patient's perception shows no evidence of impairment. The patient's memory shows no evidence of impairment.  The patient's appetite shows no evidence of impairment, "varies."  The patient's sleep shows no evidence of impairment, "varies." The patient's insight shows no evidence of impairment.  The patient's judgement shows no evidence of impairment.      DME: None  Housing: "No place to live."  Education: "2 years of college, [employer] Issues: "court in 2 weeks for shoplifting in [place], [place]."  Access to weapons: Denied  Substance Abuse: "Cocaine and marijuana, want help with using drugs."  Outpatient Care: "[facility], Zoom Sessions, every Tuesday, Wednesday, Thursday, Friday"  Inpatient Services: "[facility], January,  2021"  Contact/Support Person: "None"  Disposition    Discussed with Dr. Randa Evens, patient does not meet criteria for acute psychiatric admission. Patient is not suicidal, homicidal, nor psychotic. Patient encouraged to return to the emergency room, if the need arises. Patient given referral list to follow up with the outpatient provider of choice. Patient also stated that she will continue the Zoom sessions with [facility]. Patient discharged per ED.     Patient also given shelter list.    Lenice Llamas, RN, BSN

## 2020-03-16 ENCOUNTER — Inpatient Hospital Stay
Admit: 2020-03-16 | Discharge: 2020-03-17 | Disposition: A | Discharge status: Home / Self Care | Attending: Emergency Medicine

## 2020-03-16 DIAGNOSIS — Z760 Encounter for issue of repeat prescription: Secondary | ICD-10-CM
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
## 2020-03-16 NOTE — ED Notes (Signed)
Forensics with patient
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
## 2020-03-17 LAB — CBC
Hematocrit: 32.9 % — ABNORMAL LOW (ref 35.0–47.0)
Hemoglobin: 11.5 g/dL (ref 11.5–16.0)
MCH: 28.3 PG (ref 26.0–34.0)
MCHC: 35 g/dL (ref 30.0–36.5)
MCV: 80.8 FL (ref 80.0–99.0)
MPV: 8.9 FL (ref 8.9–12.9)
NRBC Absolute: 0 10*3/uL (ref 0.00–0.01)
Nucleated RBCs: 0 PER 100 WBC
Platelets: 404 10*3/uL — ABNORMAL HIGH (ref 150–400)
RBC: 4.07 M/uL (ref 3.80–5.20)
RDW: 13.2 % (ref 11.5–14.5)
WBC: 12.2 10*3/uL — ABNORMAL HIGH (ref 3.6–11.0)

## 2020-03-17 LAB — COMPREHENSIVE METABOLIC PANEL
ALT: 24 U/L (ref 12–78)
AST: 40 U/L — ABNORMAL HIGH (ref 15–37)
Albumin/Globulin Ratio: 1.1 (ref 1.1–2.2)
Albumin: 4 g/dL (ref 3.5–5.0)
Alkaline Phosphatase: 79 U/L (ref 45–117)
Anion Gap: 7 mmol/L (ref 5–15)
BUN: 18 MG/DL (ref 6–20)
Bun/Cre Ratio: 24 — ABNORMAL HIGH (ref 12–20)
CO2: 23 mmol/L (ref 21–32)
Calcium: 9.4 MG/DL (ref 8.5–10.1)
Chloride: 106 mmol/L (ref 97–108)
Creatinine: 0.75 MG/DL (ref 0.55–1.02)
EGFR IF NonAfrican American: >60 mL/min/{1.73_m2} (ref >60)
GFR African American: >60 mL/min/{1.73_m2} (ref >60)
Globulin: 3.6 g/dL (ref 2.0–4.0)
Glucose: 98 mg/dL (ref 65–100)
Potassium: 3.6 mmol/L (ref 3.5–5.1)
Sodium: 136 mmol/L (ref 136–145)
Total Bilirubin: 0.4 MG/DL (ref 0.2–1.0)
Total Protein: 7.6 g/dL (ref 6.4–8.2)

## 2020-03-17 LAB — HEPATITIS B CORE AB, IGM
Hep B Core Ab, IgM: NONREACTIVE
Hepatitis B core, IgM: NONREACTIVE

## 2020-03-17 LAB — HEPATITIS C ANTIBODY: Hepatitis C Ab: NONREACTIVE

## 2020-03-17 LAB — HEPATITIS B SURFACE ANTIBODY
Hep B S Ab: REACTIVE — AB
Hepatitis B Surface Ag: >1000 m[IU]/mL

## 2020-03-17 LAB — HIV-1,2 P24 AG/AB SCREEN
HIV-1,2 AB: NONREACTIVE
HIV-1,2 Ab: NONREACTIVE
P24 Antigen: NONREACTIVE
p24 Antigen: NONREACTIVE

## 2020-03-17 LAB — HEPATITIS B SURFACE ANTIGEN: Hepatitis B Surface Ag: <0.1 Index

## 2020-03-17 LAB — RPR
RPR: NONREACTIVE
RPR: NONREACTIVE

## 2020-03-17 LAB — HCG URINE, QL. - POC
HCG, Pregnancy, Urine, POC: NEGATIVE
Pregnancy test,urine (POC): NEGATIVE

## 2020-03-17 LAB — METABOLIC PANEL, COMPREHENSIVE
A-G Ratio: 1.1 (ref 1.1–2.2)
ALT (SGPT): 24 U/L (ref 12–78)
AST (SGOT): 40 U/L — ABNORMAL HIGH (ref 15–37)
Albumin: 4 g/dL (ref 3.5–5.0)
Alk. phosphatase: 79 U/L (ref 45–117)
Anion gap: 7 mmol/L (ref 5–15)
BUN/Creatinine ratio: 24 — ABNORMAL HIGH (ref 12–20)
BUN: 18 MG/DL (ref 6–20)
Bilirubin, total: 0.4 MG/DL (ref 0.2–1.0)
CO2: 23 mmol/L (ref 21–32)
Calcium: 9.4 MG/DL (ref 8.5–10.1)
Chloride: 106 mmol/L (ref 97–108)
Creatinine: 0.75 MG/DL (ref 0.55–1.02)
GFR est AA: >60 mL/min/{1.73_m2} (ref >60)
GFR est non-AA: >60 mL/min/{1.73_m2} (ref >60)
Globulin: 3.6 g/dL (ref 2.0–4.0)
Glucose: 98 mg/dL (ref 65–100)
Potassium: 3.6 mmol/L (ref 3.5–5.1)
Protein, total: 7.6 g/dL (ref 6.4–8.2)
Sodium: 136 mmol/L (ref 136–145)

## 2020-03-17 LAB — HEPATITIS C AB: Hep C virus Ab Interp.: NONREACTIVE

## 2020-03-17 LAB — CBC W/O DIFF
ABSOLUTE NRBC: 0 10*3/uL (ref 0.00–0.01)
HCT: 32.9 % — ABNORMAL LOW (ref 35.0–47.0)
HGB: 11.5 g/dL (ref 11.5–16.0)
MCH: 28.3 PG (ref 26.0–34.0)
MCHC: 35 g/dL (ref 30.0–36.5)
MCV: 80.8 FL (ref 80.0–99.0)
MPV: 8.9 FL (ref 8.9–12.9)
NRBC: 0 PER 100 WBC
PLATELET: 404 10*3/uL — ABNORMAL HIGH (ref 150–400)
RBC: 4.07 M/uL (ref 3.80–5.20)
RDW: 13.2 % (ref 11.5–14.5)
WBC: 12.2 10*3/uL — ABNORMAL HIGH (ref 3.6–11.0)

## 2020-03-17 LAB — HEP B SURFACE AB
Hep B surface Ab Interp.: REACTIVE — AB
Hepatitis B surface Ab: >1000 m[IU]/mL

## 2020-03-17 LAB — HEP B SURFACE AG
Hep B surface Ag Interp.: NEGATIVE
Hepatitis B surface Ag: <0.1 Index

## 2020-03-17 LAB — SAMPLES BEING HELD

## 2020-03-17 MED ORDER — ONDANSETRON 4 MG TAB, RAPID DISSOLVE
4 mg | ORAL_TABLET | Freq: Three times a day (TID) | ORAL | 0 refills | Status: AC | PRN
Start: 2020-03-17 — End: ?

## 2020-03-17 MED ORDER — DULOXETINE 30 MG CAP, DELAYED RELEASE
30 mg | ORAL_CAPSULE | Freq: Every day | ORAL | 0 refills | Status: AC
Start: 2020-03-17 — End: ?

## 2020-03-17 MED ORDER — RALTEGRAVIR 400 MG TAB
400 mg | ORAL | Status: AC
Start: 2020-03-17 — End: 2020-03-16
  Administered 2020-03-17: 02:00:00 via ORAL

## 2020-03-17 MED ORDER — LISINOPRIL 5 MG TAB
5 mg | Freq: Once | ORAL | Status: DC
Start: 2020-03-17 — End: 2020-03-17

## 2020-03-17 MED ORDER — CEFTRIAXONE 250 MG SOLUTION FOR INJECTION
250 mg | Freq: Once | INTRAMUSCULAR | Status: AC
Start: 2020-03-17 — End: 2020-03-16
  Administered 2020-03-17: 02:00:00 via INTRAMUSCULAR

## 2020-03-17 MED ORDER — ONDANSETRON 4 MG TAB, RAPID DISSOLVE
4 mg | Freq: Three times a day (TID) | ORAL | Status: DC | PRN
Start: 2020-03-17 — End: 2020-03-17
  Administered 2020-03-17: 02:00:00 via ORAL

## 2020-03-17 MED ORDER — QUETIAPINE 100 MG TAB
100 mg | ORAL_TABLET | Freq: Every evening | ORAL | 0 refills | Status: AC
Start: 2020-03-17 — End: ?

## 2020-03-17 MED ORDER — EMTRICITABINE-TENOFOVIR 200 MG-300 MG TAB
200-300 mg | ORAL_TABLET | Freq: Every day | ORAL | 0 refills | Status: AC
Start: 2020-03-17 — End: 2020-04-14

## 2020-03-17 MED ORDER — HYDROXYZINE 50 MG TAB
50 mg | ORAL_TABLET | Freq: Three times a day (TID) | ORAL | 0 refills | Status: AC | PRN
Start: 2020-03-17 — End: 2020-03-26

## 2020-03-17 MED ORDER — HYDROXYZINE 25 MG TAB
25 mg | ORAL | Status: AC
Start: 2020-03-17 — End: 2020-03-16
  Administered 2020-03-17: 01:00:00 via ORAL

## 2020-03-17 MED ORDER — EMTRICITABINE-TENOFOVIR 200 MG-300 MG TAB
200-300 mg | ORAL | Status: AC
Start: 2020-03-17 — End: 2020-03-16
  Administered 2020-03-17: 02:00:00 via ORAL

## 2020-03-17 MED ORDER — RALTEGRAVIR 400 MG TAB
400 mg | ORAL_TABLET | Freq: Two times a day (BID) | ORAL | 0 refills | Status: AC
Start: 2020-03-17 — End: 2020-04-14

## 2020-03-17 MED ORDER — GABAPENTIN 100 MG CAP
100 mg | ORAL_CAPSULE | Freq: Three times a day (TID) | ORAL | 0 refills | Status: AC
Start: 2020-03-17 — End: ?

## 2020-03-17 MED ORDER — LISINOPRIL-HYDROCHLOROTHIAZIDE 10 MG-12.5 MG TAB
ORAL_TABLET | Freq: Every day | ORAL | 0 refills | Status: AC
Start: 2020-03-17 — End: ?

## 2020-03-17 MED ORDER — BUSPIRONE 30 MG TAB
30 mg | ORAL_TABLET | Freq: Every day | ORAL | 0 refills | Status: AC
Start: 2020-03-17 — End: ?

## 2020-03-17 MED ORDER — AZITHROMYCIN 250 MG TAB
250 mg | Freq: Once | ORAL | Status: AC
Start: 2020-03-17 — End: 2020-03-16
  Administered 2020-03-17: 02:00:00 via ORAL

## 2020-03-17 MED ORDER — LEVONORGESTREL 1.5 MG TAB
1.5 mg | Freq: Once | ORAL | Status: AC
Start: 2020-03-17 — End: 2020-03-16
  Administered 2020-03-17: 02:00:00 via ORAL

## 2020-03-17 MED FILL — ISENTRESS 400 MG TAB: 400 mg | ORAL | Qty: 1

## 2020-03-17 MED FILL — HYDROXYZINE 25 MG TAB: 25 mg | ORAL | Qty: 2

## 2020-03-17 MED FILL — HYDROCHLOROTHIAZIDE 25 MG TAB: 25 mg | ORAL | Qty: 1

## 2020-03-17 MED FILL — AZITHROMYCIN 250 MG TAB: 250 mg | ORAL | Qty: 4

## 2020-03-17 MED FILL — TRUVADA 200 MG-300 MG TABLET: 200-300 mg | ORAL | Qty: 1

## 2020-03-17 MED FILL — CEFTRIAXONE 250 MG SOLUTION FOR INJECTION: 250 mg | INTRAMUSCULAR | Qty: 250

## 2020-03-17 MED FILL — ONDANSETRON 4 MG TAB, RAPID DISSOLVE: 4 mg | ORAL | Qty: 1

## 2020-03-17 MED FILL — MY WAY 1.5 MG TABLET: 1.5 mg | ORAL | Qty: 1

## 2020-03-17 NOTE — Telephone Encounter (Signed)
Called pt to schedule follow up, no answer VM full
